# Patient Record
Sex: Female | Born: 1960 | Race: Black or African American | Hispanic: No | Marital: Single | Smoking: Former smoker
Health system: Southern US, Community
[De-identification: ages and names within clinical notes are randomized; demographics above are authoritative.]

## PROBLEM LIST (undated history)

## (undated) DIAGNOSIS — E785 Hyperlipidemia, unspecified: Secondary | ICD-10-CM

## (undated) DIAGNOSIS — E559 Vitamin D deficiency, unspecified: Secondary | ICD-10-CM

## (undated) DIAGNOSIS — I10 Essential (primary) hypertension: Secondary | ICD-10-CM

## (undated) DIAGNOSIS — C801 Malignant (primary) neoplasm, unspecified: Secondary | ICD-10-CM

## (undated) DIAGNOSIS — J45909 Unspecified asthma, uncomplicated: Secondary | ICD-10-CM

## (undated) DIAGNOSIS — E079 Disorder of thyroid, unspecified: Secondary | ICD-10-CM

## (undated) HISTORY — DX: Essential (primary) hypertension: I10

## (undated) HISTORY — DX: Unspecified asthma, uncomplicated: J45.909

## (undated) HISTORY — DX: Disorder of thyroid, unspecified: E07.9

## (undated) HISTORY — DX: Malignant (primary) neoplasm, unspecified: C80.1

## (undated) HISTORY — DX: Hyperlipidemia, unspecified: E78.5

## (undated) HISTORY — DX: Vitamin D deficiency, unspecified: E55.9

---

## 2002-02-06 DIAGNOSIS — C801 Malignant (primary) neoplasm, unspecified: Secondary | ICD-10-CM

## 2002-02-06 HISTORY — PX: BREAST LUMPECTOMY: SHX2

## 2002-02-06 HISTORY — DX: Malignant (primary) neoplasm, unspecified: C80.1

## 2008-02-05 ENCOUNTER — Ambulatory Visit: Payer: Self-pay | Admitting: Diagnostic Radiology

## 2008-02-05 ENCOUNTER — Ambulatory Visit (HOSPITAL_BASED_OUTPATIENT_CLINIC_OR_DEPARTMENT_OTHER): Admission: RE | Admit: 2008-02-05 | Discharge: 2008-02-05 | Payer: Self-pay | Admitting: Internal Medicine

## 2012-02-14 ENCOUNTER — Encounter: Payer: Self-pay | Admitting: Family

## 2012-02-14 ENCOUNTER — Ambulatory Visit (INDEPENDENT_AMBULATORY_CARE_PROVIDER_SITE_OTHER): Payer: BC Managed Care – PPO | Admitting: Family

## 2012-02-14 ENCOUNTER — Ambulatory Visit (HOSPITAL_BASED_OUTPATIENT_CLINIC_OR_DEPARTMENT_OTHER)
Admission: RE | Admit: 2012-02-14 | Discharge: 2012-02-14 | Disposition: A | Discharge status: Home / Self Care | Payer: BC Managed Care – PPO | Source: Ambulatory Visit | Attending: Family | Admitting: Family

## 2012-02-14 VITALS — BP 118/84 | HR 78 | Temp 97.7°F | Resp 16 | Ht 66.25 in | Wt 175.0 lb

## 2012-02-14 DIAGNOSIS — E049 Nontoxic goiter, unspecified: Secondary | ICD-10-CM | POA: Insufficient documentation

## 2012-02-14 DIAGNOSIS — E010 Iodine-deficiency related diffuse (endemic) goiter: Secondary | ICD-10-CM

## 2012-02-14 DIAGNOSIS — Z853 Personal history of malignant neoplasm of breast: Secondary | ICD-10-CM | POA: Insufficient documentation

## 2012-02-14 DIAGNOSIS — I10 Essential (primary) hypertension: Secondary | ICD-10-CM | POA: Insufficient documentation

## 2012-02-14 DIAGNOSIS — J45909 Unspecified asthma, uncomplicated: Secondary | ICD-10-CM

## 2012-02-14 DIAGNOSIS — E039 Hypothyroidism, unspecified: Secondary | ICD-10-CM | POA: Insufficient documentation

## 2012-02-14 LAB — BASIC METABOLIC PANEL
Calcium: 9.8 mg/dL (ref 8.4–10.5)
Sodium: 140 mEq/L (ref 135–145)

## 2012-02-14 MED ORDER — LISINOPRIL-HYDROCHLOROTHIAZIDE 20-25 MG PO TABS: 1.0000 | ORAL_TABLET | Freq: Every day | ORAL | Status: DC

## 2012-02-14 MED ORDER — LEVOTHYROXINE SODIUM 100 MCG PO TABS: 100.0000 ug | ORAL_TABLET | Freq: Every day | ORAL | Status: DC

## 2012-02-14 MED ORDER — ALBUTEROL SULFATE HFA 108 (90 BASE) MCG/ACT IN AERS: 2.0000 | INHALATION_SPRAY | Freq: Four times a day (QID) | RESPIRATORY_TRACT | Status: DC | PRN

## 2012-02-14 NOTE — Assessment & Plan Note (Signed)
Well controlled on lisinopril hctz.  Continue same, obtain bmet.

## 2012-02-14 NOTE — Assessment & Plan Note (Signed)
Reports that she has been of synthroid x 2 months. Resume and plan to check TSH in 1 month.

## 2012-02-14 NOTE — Assessment & Plan Note (Signed)
Patient continues to follow with oncology for surveillance.  Reports mammogram is up to date.

## 2012-02-14 NOTE — Progress Notes (Signed)
Subjective:    Patient ID: Donna Pace, female    DOB: May 10, 1960, 52 y.o.   MRN: [id number]  HPI  Ms. Ratajczak "Donna Pace"  is a 52 yr old female here today to establish care.  1) Hypothyroid- this is treated with levothyroxine . She has been treated for >20 yrs.    2) HTN- currently on lisinopril-HCTZ.  No complaints.    3) Asthma- She uses albuterol prn. Uses a few times a week.   4) Breast cancer- right breast 2004,  She underwent lumpectomy followed by chemotherapy and radiation.  She sees Dr. Allison Quarry. She sees oncology annually.  Review of Systems  Constitutional: Negative for unexpected weight change.  HENT: Negative for hearing loss and congestion.   Eyes: Negative for visual disturbance.  Respiratory: Negative for cough.   Cardiovascular: Negative for leg swelling.  Gastrointestinal: Negative for vomiting, diarrhea and constipation.  Genitourinary: Negative for dysuria and frequency.  Musculoskeletal: Negative for myalgias and arthralgias.  Skin: Negative for rash.  Neurological: Negative for headaches.  Hematological: Negative for adenopathy.  Psychiatric/Behavioral:       Denies hx or concerns about depression/anxiety   Past Medical History  Diagnosis Date  . Hypertension   . Asthma   . Thyroid disease     hypothyroidism  . Cancer 2004    breast cancer    History   Social History  . Marital Status: Married    Spouse Name: N/A    Number of Children: N/A  . Years of Education: N/A   Occupational History  . Not on file.   Social History Main Topics  . Smoking status: Current Every Day Smoker    Types: Cigarettes  . Smokeless tobacco: Never Used     Comment: 5 cigarettes daily  . Alcohol Use: No  . Drug Use: Not on file  . Sexually Active: Not on file   Other Topics Concern  . 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  [place] has 4 grandchildrenShe  completed HS    Past Surgical History  Procedure Date  . Breast lumpectomy 2004    right breast    Family History  Problem Relation Age of Onset  . Diabetes Mother   . Hypertension Mother   . Diabetes Father   . Cancer Sister     bone cancer    Allergies  Allergen Reactions  . Penicillins Rash    Current Outpatient Prescriptions on File Prior to Visit  Medication Sig Dispense Refill  . albuterol (VENTOLIN HFA) 108 (90 BASE) MCG/ACT inhaler Inhale 2 puffs into the lungs every 6 (six) hours as needed.  1 Inhaler  2  . levothyroxine (SYNTHROID, LEVOTHROID) 100 MCG tablet Take 1 tablet (100 mcg total) by mouth daily.  30 tablet  2  . lisinopril-hydrochlorothiazide (PRINZIDE,ZESTORETIC) 20-25 MG per tablet Take 1 tablet by mouth daily.  30 tablet  3    BP 118/84  Pulse 78  Temp 97.7 F (36.5 C) (Oral)  Resp 16  Ht 5' 6.25" (1.683 m)  Wt 175 lb 0.6 oz (79.398 kg)  BMI 28.04 kg/m2  SpO2 99%  LMP 02/06/2006       Objective:   Physical Exam  Constitutional: She is oriented to person, place, and time. She appears well-developed and well-nourished. No distress.  HENT:  Head: Normocephalic and atraumatic.  Right Ear: Tympanic membrane and ear canal normal.  Left  Ear: Tympanic membrane and ear canal normal.  Mouth/Throat: No oropharyngeal exudate, posterior oropharyngeal edema or posterior oropharyngeal erythema.  Neck: Thyromegaly present.  Cardiovascular: Normal rate and regular rhythm.   No murmur heard. Pulmonary/Chest: Effort normal and breath sounds normal. No respiratory distress. She has no wheezes. She has no rales. She exhibits no tenderness.  Musculoskeletal: She exhibits no edema.  Neurological: She is alert and oriented to person, place, and time.  Skin: Skin is warm and dry.  Psychiatric: She has a normal mood and affect. Her behavior is normal. Judgment and thought content normal.          Assessment & Plan:

## 2012-02-14 NOTE — Assessment & Plan Note (Signed)
Seems well controlled on albuterol.  Continue prn.

## 2012-02-14 NOTE — Patient Instructions (Addendum)
Please complete your lab work prior to leaving. Follow up in 1 month for a fasting physical. Welcome to [facility]!

## 2012-02-16 ENCOUNTER — Encounter: Payer: Self-pay | Admitting: Family

## 2012-02-26 ENCOUNTER — Telehealth: Payer: Self-pay | Admitting: Family

## 2012-02-26 NOTE — Telephone Encounter (Signed)
Received medical records from [facility]

## 2012-03-15 ENCOUNTER — Encounter: Payer: Self-pay | Admitting: Family

## 2012-03-15 ENCOUNTER — Ambulatory Visit (INDEPENDENT_AMBULATORY_CARE_PROVIDER_SITE_OTHER): Payer: BC Managed Care – PPO | Admitting: Family

## 2012-03-15 VITALS — BP 110/70 | HR 84 | Temp 98.1°F | Resp 16 | Ht 66.25 in | Wt 170.0 lb

## 2012-03-15 DIAGNOSIS — Z0000 Encounter for general adult medical examination without abnormal findings: Secondary | ICD-10-CM | POA: Insufficient documentation

## 2012-03-15 DIAGNOSIS — Z23 Encounter for immunization: Secondary | ICD-10-CM

## 2012-03-15 LAB — CBC WITH DIFFERENTIAL/PLATELET
Eosinophils Relative: 4 % (ref 0–5)
HCT: 37.6 % (ref 36.0–46.0)
Hemoglobin: 12.9 g/dL (ref 12.0–15.0)
Lymphocytes Relative: 39 % (ref 12–46)
Lymphs Abs: 2 10*3/uL (ref 0.7–4.0)
MCV: 88.7 fL (ref 78.0–100.0)
Monocytes Absolute: 0.3 10*3/uL (ref 0.1–1.0)
Platelets: 277 10*3/uL (ref 150–400)
RBC: 4.24 MIL/uL (ref 3.87–5.11)
WBC: 5.2 10*3/uL (ref 4.0–10.5)

## 2012-03-15 LAB — BASIC METABOLIC PANEL WITH GFR
CO2: 29 mEq/L (ref 19–32)
Chloride: 105 mEq/L (ref 96–112)
Potassium: 4.6 mEq/L (ref 3.5–5.3)
Sodium: 141 mEq/L (ref 135–145)

## 2012-03-15 LAB — HEPATIC FUNCTION PANEL
Alkaline Phosphatase: 60 U/L (ref 39–117)
Indirect Bilirubin: 0.3 mg/dL (ref 0.0–0.9)
Total Protein: 7 g/dL (ref 6.0–8.3)

## 2012-03-15 LAB — LIPID PANEL
HDL: 47 mg/dL (ref >39)
LDL Cholesterol: 129 mg/dL — ABNORMAL HIGH (ref 0–99)

## 2012-03-15 LAB — TSH: TSH: 5.299 u[IU]/mL — ABNORMAL HIGH (ref 0.350–4.500)

## 2012-03-15 MED ORDER — CALCIUM CARBONATE-VITAMIN D 600-400 MG-UNIT PO TABS: 1.0000 | ORAL_TABLET | Freq: Two times a day (BID) | ORAL | Status: AC

## 2012-03-15 NOTE — Progress Notes (Signed)
Subjective:    Patient ID: Donna Pace, female    DOB: November 30, 1960, 52 y.o.   MRN: [id number]  HPI  Patient presents today for complete physical.  Immunizations: ? Last tetanus, flu up to date Diet: eats 1-2 meals a day.  Exercise: None.  Colonoscopy: due Dexa: has not had in 9 yrs Pap Smear: Due in May with GYN Mammogram: will complete this month. Tobacco abuse-  2 packs a week.   Review of Systems  Constitutional: Negative for unexpected weight change.  HENT: Negative for congestion.   Eyes: Negative for visual disturbance.  Respiratory: Negative for cough.   Cardiovascular: Negative for chest pain.  Gastrointestinal: Negative for diarrhea and constipation.  Genitourinary: Negative for dysuria and frequency.  Musculoskeletal: Negative for myalgias and arthralgias.  Skin: Negative for rash.  Neurological: Negative for headaches.  Hematological: Negative for adenopathy.  Psychiatric/Behavioral:       Denies depression/anxiety   Past Medical History  Diagnosis Date  . Hypertension   . Asthma   . Thyroid disease     hypothyroidism  . Cancer 2004    breast cancer    History   Social History  . Marital Status: Married    Spouse Name: N/A    Number of Children: N/A  . Years of Education: N/A   Occupational History  . Not on file.   Social History Main Topics  . Smoking status: Current Every Day Smoker    Types: Cigarettes  . Smokeless tobacco: Never Used     Comment: 5 cigarettes daily  . Alcohol Use: No  . Drug Use: Not on file  . Sexually Active: Not on file   Other Topics Concern  . 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  [place] has 4 grandchildrenShe completed HS    Past Surgical History  Procedure Date  . Breast lumpectomy 2004    right breast    Family History  Problem Relation Age of Onset  . Diabetes Mother   . Hypertension Mother   .  Diabetes Father   . Cancer Sister     bone cancer    Allergies  Allergen Reactions  . Penicillins Rash    Current Outpatient Prescriptions on File Prior to Visit  Medication Sig Dispense Refill  . albuterol (VENTOLIN HFA) 108 (90 BASE) MCG/ACT inhaler Inhale 2 puffs into the lungs every 6 (six) hours as needed.  1 Inhaler  2  . levothyroxine (SYNTHROID, LEVOTHROID) 100 MCG tablet Take 1 tablet (100 mcg total) by mouth daily.  30 tablet  2  . lisinopril-hydrochlorothiazide (PRINZIDE,ZESTORETIC) 20-25 MG per tablet Take 1 tablet by mouth daily.  30 tablet  3  . Calcium Carbonate-Vitamin D (CALTRATE 600+D) 600-400 MG-UNIT per tablet Take 1 tablet by mouth 2 (two) times daily.        BP 110/70  Pulse 84  Temp 98.1 F (36.7 C) (Oral)  Resp 16  Ht 5' 6.25" (1.683 m)  Wt 170 lb (77.111 kg)  BMI 27.23 kg/m2  SpO2 99%  LMP 02/06/2006       Objective:   Physical Exam Physical Exam  Constitutional: She is oriented to person, place, and time. She appears well-developed and well-nourished. No distress.  HENT:  Head: Normocephalic and atraumatic.  Right Ear: Tympanic membrane and ear canal normal.  Left Ear: Tympanic membrane and ear canal normal.  Mouth/Throat: Oropharynx is clear and  moist.  Eyes: Pupils are equal, round, and reactive to light. No scleral icterus.  Neck: Normal range of motion. No thyromegaly present.  Cardiovascular: Normal rate and regular rhythm.   No murmur heard. Pulmonary/Chest: Effort normal and breath sounds normal. No respiratory distress. He has no wheezes. She has no rales. She exhibits no tenderness.  Abdominal: Soft. Bowel sounds are normal. He exhibits no distension and no mass. There is no tenderness. There is no rebound and no guarding.  Musculoskeletal: She exhibits no edema.  Lymphadenopathy:    She has no cervical adenopathy.  Neurological: She is alert and oriented to person, place, and time.  She exhibits normal muscle tone. Coordination  normal.  Skin: Skin is warm and dry.  Psychiatric: She has a normal mood and affect. Her behavior is normal. Judgment and thought content normal.  Breasts: Examined lying Right: Without masses, retractions, discharge or axillary adenopathy. Scar noted right upper/outer breast with some associated scar tissue Left: Without masses, retractions, discharge or axillary adenopathy.  GYN- deferred to next visit.          Assessment & Plan:          Assessment & Plan:

## 2012-03-15 NOTE — Patient Instructions (Addendum)
You will be contacted about your referral for colonoscopy.  Please let us know if you have not heard back within 1 week about your referral. Schedule bone density at front desk. Complete lab work prior to leaving.  Follow up in 6 months.

## 2012-03-15 NOTE — Assessment & Plan Note (Signed)
Pt counseled on healthy diet, exercise. Tdap today. She will complete scheduled mammogram in 1 month.  Obtain fasting labs, refer to gi for colo, order dexa. Discussed importance of tobacco cessation, recommended trial of nicorette gum. Add calcium supplement for bone health.

## 2012-03-16 ENCOUNTER — Telehealth: Payer: Self-pay | Admitting: Family

## 2012-03-16 DIAGNOSIS — R9431 Abnormal electrocardiogram [ECG] [EKG]: Secondary | ICD-10-CM

## 2012-03-16 DIAGNOSIS — E039 Hypothyroidism, unspecified: Secondary | ICD-10-CM

## 2012-03-16 LAB — URINALYSIS, ROUTINE W REFLEX MICROSCOPIC
Hgb urine dipstick: NEGATIVE
Ketones, ur: NEGATIVE mg/dL
Nitrite: NEGATIVE
Protein, ur: NEGATIVE mg/dL
Specific Gravity, Urine: 1.016 (ref 1.005–1.030)
Urobilinogen, UA: 0.2 mg/dL (ref 0.0–1.0)

## 2012-03-16 MED ORDER — LEVOTHYROXINE SODIUM 125 MCG PO TABS: 125.0000 ug | ORAL_TABLET | Freq: Every day | ORAL | Status: DC

## 2012-03-16 NOTE — Telephone Encounter (Signed)
Pls let pt know that I would like to increase her synthroid from to .  She should repeat TSH in 6 weeks. (Dx Hypothyroid) Also, mild abnormality on EKG- may be normal for her, but I would like her to complete a stress test (pended below) to be certain that her heart is healthy.

## 2012-03-19 NOTE — Telephone Encounter (Signed)
Pt left message returning my call. Attempted to reach pt and left detailed message at [phone] re: instructions below and to call if any questions. Future lab order entered. Copy given to the lab and mailed to pt as a reminder.

## 2012-03-19 NOTE — Telephone Encounter (Signed)
Left message on home # to return my call. 

## 2012-03-28 ENCOUNTER — Other Ambulatory Visit: Payer: BC Managed Care – PPO

## 2012-04-02 ENCOUNTER — Telehealth: Payer: Self-pay | Admitting: Family

## 2012-04-02 DIAGNOSIS — R9431 Abnormal electrocardiogram [ECG] [EKG]: Secondary | ICD-10-CM

## 2012-04-02 NOTE — Addendum Note (Signed)
Addended by: Sandford Craze on: 04/02/2012 01:23 PM   Modules accepted: Orders

## 2012-04-02 NOTE — Telephone Encounter (Signed)
Spoke with physician at [facility]. Reviewed pt case, EKG. She denied myocardial imaging- "doesn't meet criteria."  She says only exercise treadmill without imaging will be covered.  Will order.  Myriam Jacobson, could you please make sure that this is scheduled with cards? Thanks.

## 2012-04-02 NOTE — Telephone Encounter (Signed)
Message copied by Sandford Craze on Tue Apr 02, 2012  1:06 PM ------      Message from: Eulah Pont      Created: Tue Mar 26, 2012  3:43 PM      Regarding: Peer to Peer For Nuclear Stress        Peer to Peer Required-    [phone]   Ref  #  [id number] ------

## 2012-04-03 ENCOUNTER — Telehealth: Payer: Self-pay | Admitting: *Deleted

## 2012-04-03 NOTE — Telephone Encounter (Signed)
Received denial from [facility] that nuclear stress test has been denied as documentation has not been provided stating pt is unable to walk on a treadmill. Left detailed message on pt's home# to call and let us know if she would be unable to walk on the treadmill or if she has knee problems, uses cane, etc. . . . Marland Kitchen

## 2012-04-08 NOTE — Telephone Encounter (Signed)
Attempted to reach pt and left detailed message on cell# re: below request.

## 2012-04-09 ENCOUNTER — Encounter: Payer: Self-pay | Admitting: Family

## 2012-04-09 DIAGNOSIS — I340 Nonrheumatic mitral (valve) insufficiency: Secondary | ICD-10-CM | POA: Insufficient documentation

## 2012-04-09 DIAGNOSIS — E559 Vitamin D deficiency, unspecified: Secondary | ICD-10-CM

## 2012-04-09 DIAGNOSIS — E785 Hyperlipidemia, unspecified: Secondary | ICD-10-CM | POA: Insufficient documentation

## 2012-04-09 HISTORY — DX: Vitamin D deficiency, unspecified: E55.9

## 2012-04-09 NOTE — Telephone Encounter (Signed)
Unable to reach pt re: treadmill stress test. I have left 2 detailed messages for pt to call us back with requested info below and have gotten no response. Please advise if you want to proceed with standard treadmill and if so please place order.

## 2012-04-09 NOTE — Telephone Encounter (Signed)
I do want to proceed with exercise treadmill and order has been placed.

## 2012-04-15 ENCOUNTER — Encounter: Payer: BC Managed Care – PPO | Admitting: Physician Assistant

## 2012-04-18 ENCOUNTER — Telehealth: Payer: Self-pay | Admitting: Family

## 2012-04-18 NOTE — Telephone Encounter (Signed)
Message copied by Sandford Craze on Thu Apr 18, 2012 12:34 PM ------      Message from: Darral Dash E      Created: Thu Apr 18, 2012  8:48 AM      Regarding: FW: GXT                    FYI            ----- Message -----         From: Gerome Sam         Sent: 04/16/2012  10:43 AM           To: Keith Rake Aheron      Subject: GXT                                                      04/16/12 Patient cancel.       ------

## 2012-06-05 ENCOUNTER — Telehealth: Payer: Self-pay | Admitting: Family

## 2012-06-05 MED ORDER — LEVOTHYROXINE SODIUM 125 MCG PO TABS: 125.0000 ug | ORAL_TABLET | Freq: Every day | ORAL | Status: DC

## 2012-06-05 NOTE — Telephone Encounter (Signed)
Rx sent in to pharmacy. 

## 2012-06-05 NOTE — Telephone Encounter (Signed)
Refill- levothyroxine 0.125mg ( ) tab. TK 1 T PO QD. Qty 30 last fill 4.26.14

## 2012-06-19 ENCOUNTER — Ambulatory Visit: Payer: BC Managed Care – PPO | Admitting: Family

## 2012-06-19 DIAGNOSIS — Z0289 Encounter for other administrative examinations: Secondary | ICD-10-CM

## 2012-07-23 ENCOUNTER — Telehealth: Payer: Self-pay | Admitting: Family

## 2012-07-23 MED ORDER — ALBUTEROL SULFATE HFA 108 (90 BASE) MCG/ACT IN AERS: 2.0000 | INHALATION_SPRAY | Freq: Four times a day (QID) | RESPIRATORY_TRACT | Status: AC | PRN

## 2012-07-23 NOTE — Telephone Encounter (Signed)
Ventolin hfa inh w dos ctr 200 puffs inhale 2 puffs by mouthj into the lungs every 6 hours as needed last fill 06-08-12

## 2012-08-14 ENCOUNTER — Telehealth: Payer: Self-pay | Admitting: Family

## 2012-08-14 NOTE — Telephone Encounter (Signed)
[facility] called stating that patient has never called back to schedule her appointment. She was referred back in February?

## 2012-08-18 ENCOUNTER — Encounter: Payer: Self-pay | Admitting: Family

## 2012-08-18 NOTE — Telephone Encounter (Signed)
See letter to pt

## 2012-08-20 NOTE — Telephone Encounter (Signed)
Letter mailed to pt.  

## 2012-11-25 ENCOUNTER — Telehealth: Payer: Self-pay | Admitting: Family

## 2012-11-25 NOTE — Telephone Encounter (Signed)
Left message for patient to return my call.

## 2012-11-25 NOTE — Telephone Encounter (Signed)
Please call pt and arrange follow up visit.  Our records show she is due for follow up.

## 2012-11-27 NOTE — Telephone Encounter (Signed)
Left detailed message informing patient that she is due for a follow up visit.

## 2012-11-28 NOTE — Telephone Encounter (Signed)
Talked to patient mother and asked her to have patient call our office to schedule an appointment

## 2012-12-12 ENCOUNTER — Other Ambulatory Visit: Payer: Self-pay

## 2014-07-26 IMAGING — US US SOFT TISSUE HEAD/NECK
1 series · 14 of 23 positions shown · non-contrast
Comparison: None.

CLINICAL DATA: Thyromegaly, hypothyroidism.

THYROID ULTRASOUND
TECHNIQUE: Ultrasound examination of the thyroid gland and adjacent
soft tissues was performed.

[Series 1: us soft tissue head/neck · 0.08mm/px · 14 of 23 slices shown]
[im 1/23]
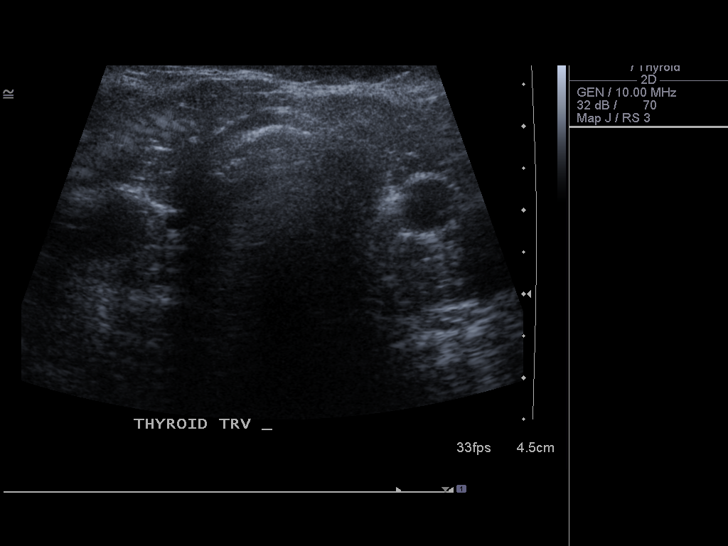
[im 3/23]
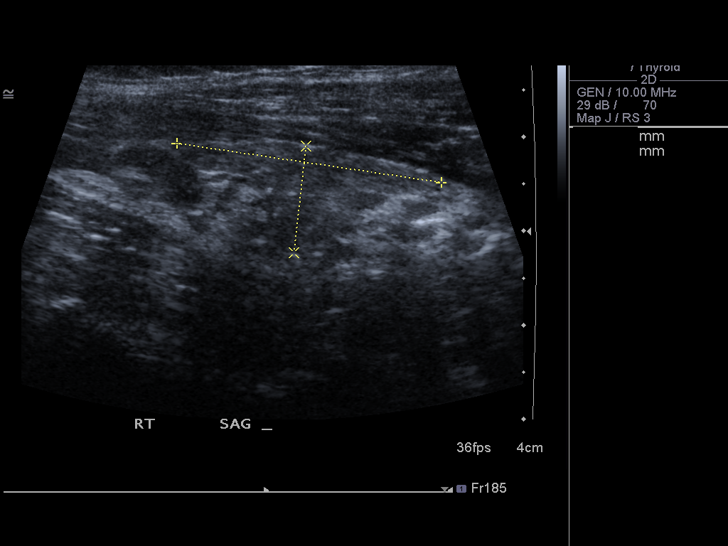
[im 5/23]
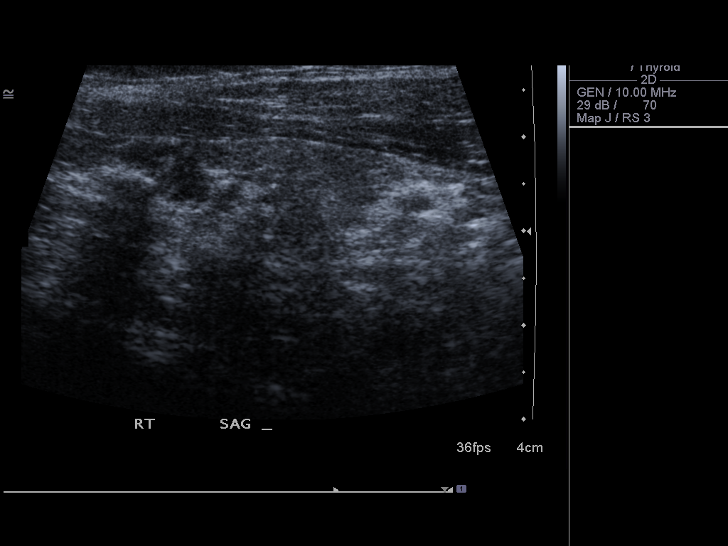
[im 6/23]
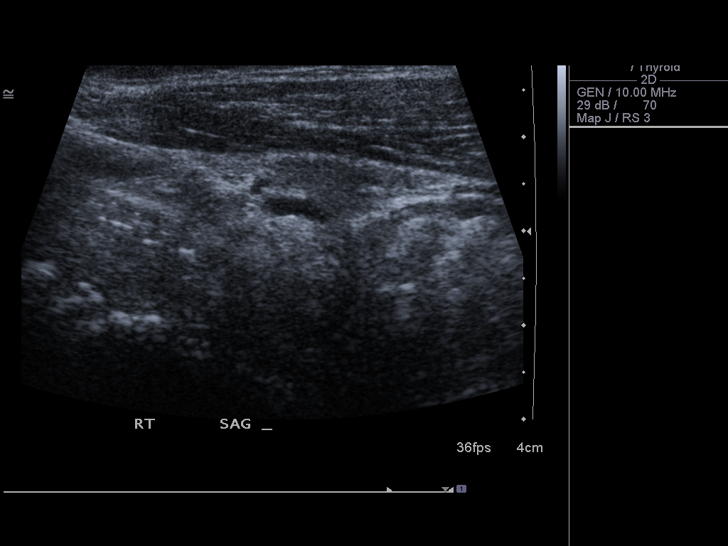
[im 8/23]
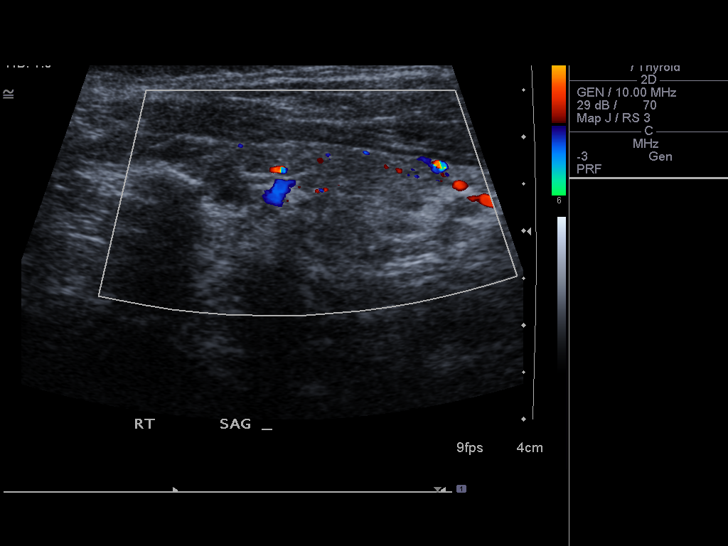
[im 10/23]
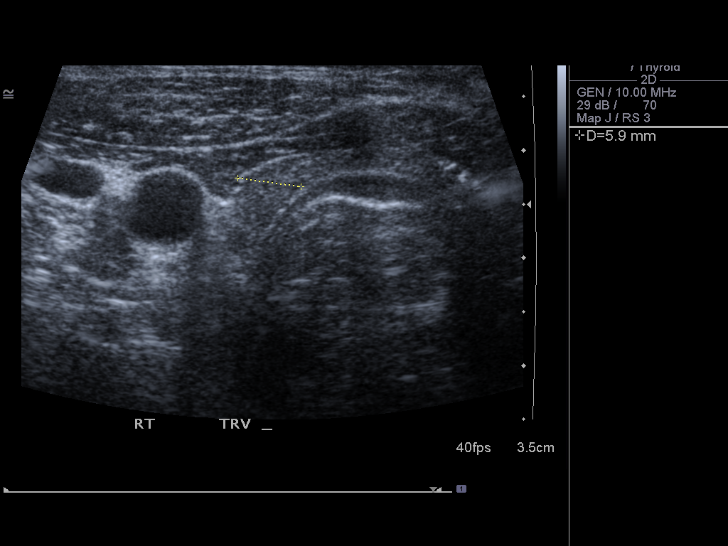
[im 11/23]
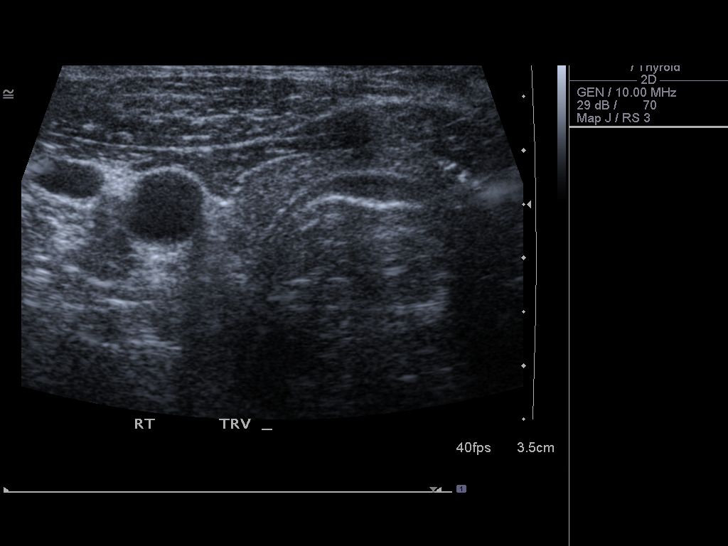
[im 13/23]
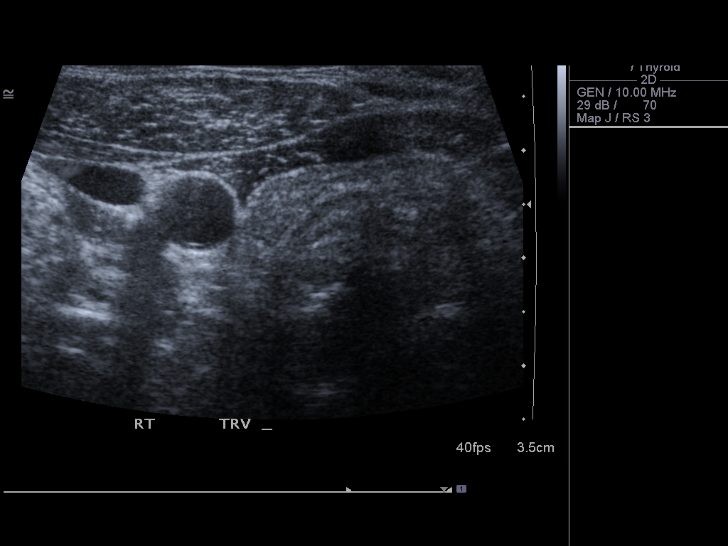
[im 14/23]
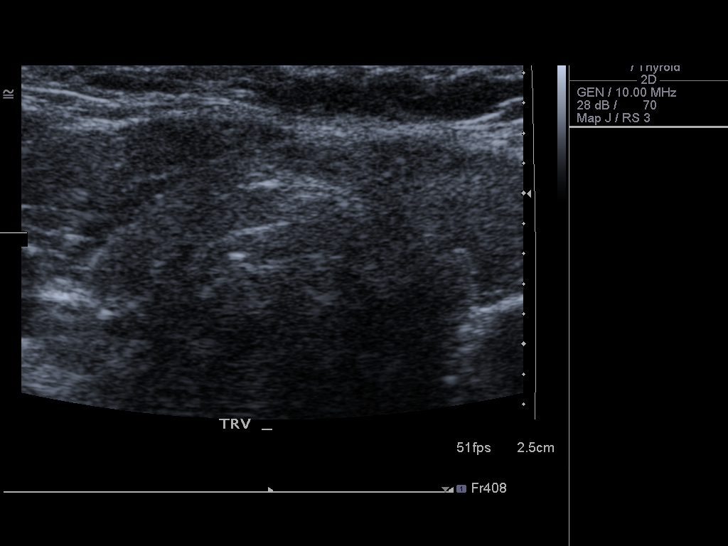
[im 16/23]
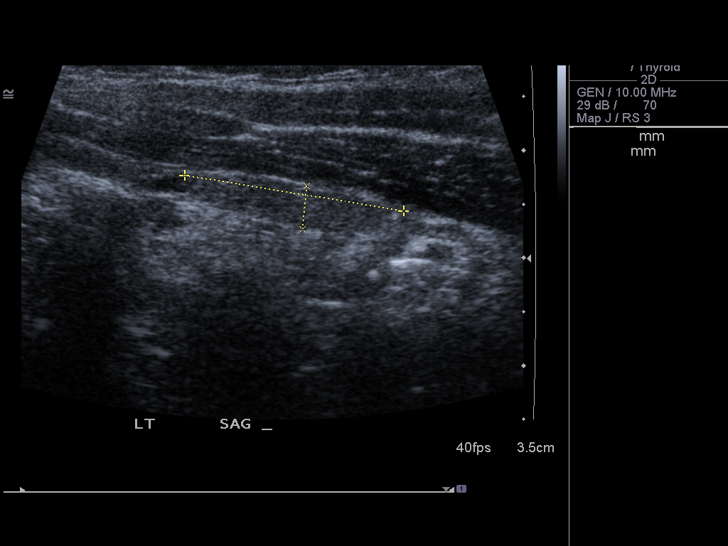
[im 18/23]
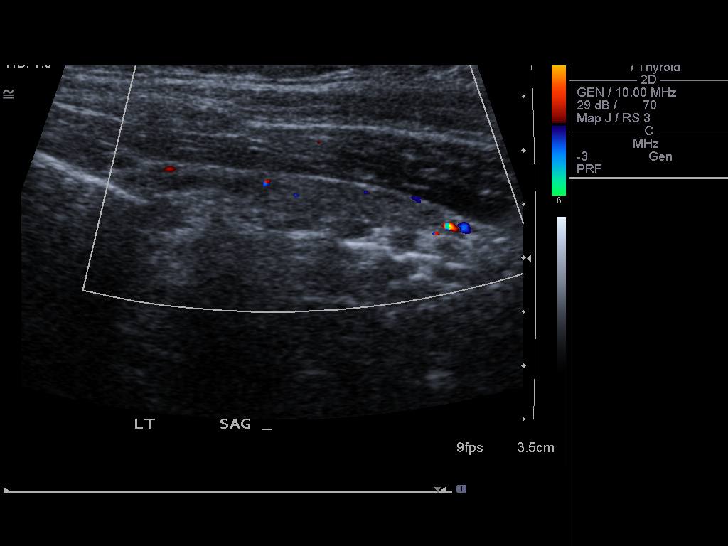
[im 19/23]
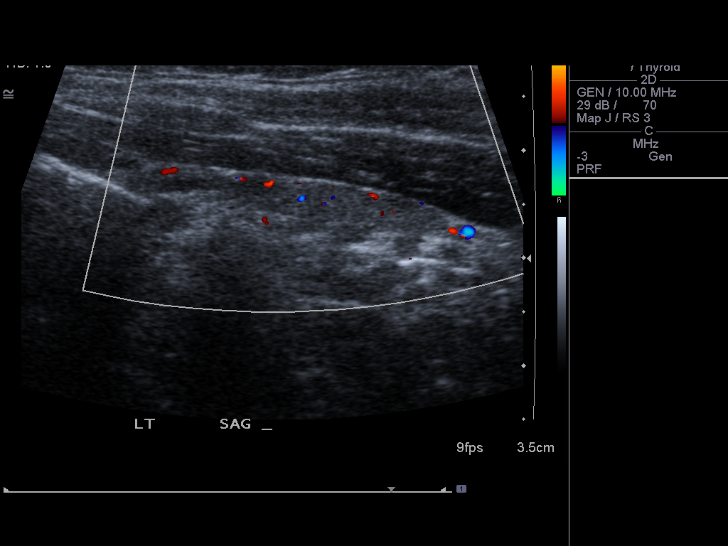
[im 21/23]
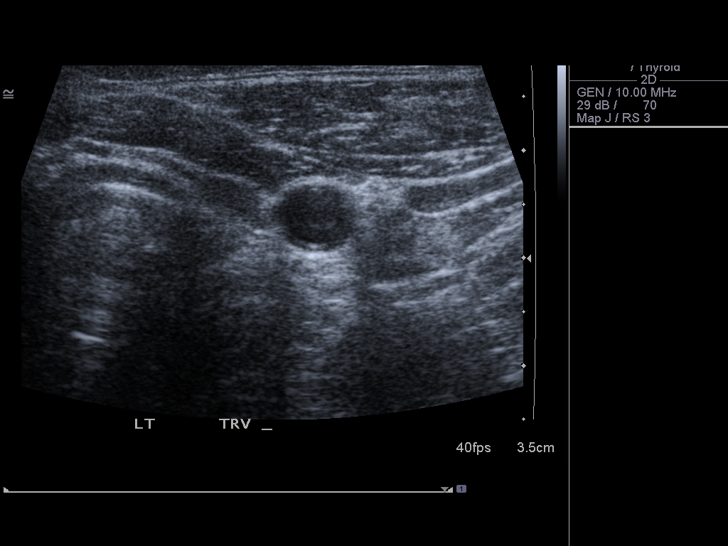
[im 23/23]
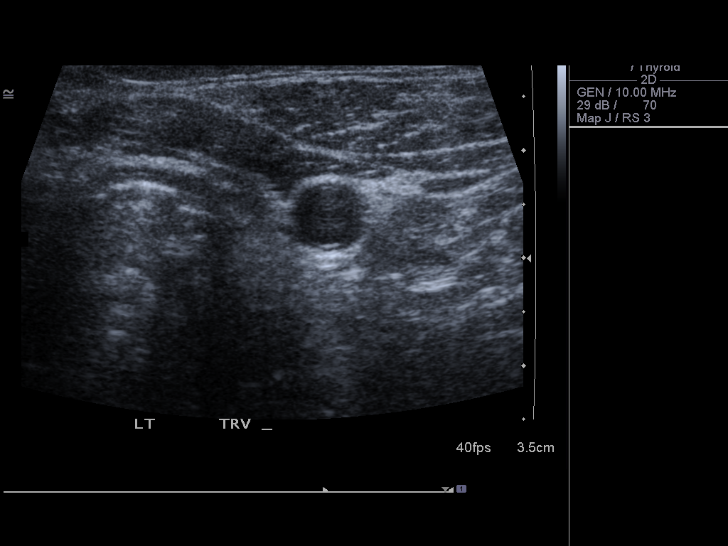

[14 of 23 positions shown; findings below may reference images not displayed]

FINDINGS: Homogeneous thyroid echotexture is noted.

Right thyroid lobe:  Measures 2.8 x 1.1 x 0.6 cm.
Left thyroid lobe:  Measures 2.1 x 0.6 x 0.4 cm.
Isthmus:  Measures 2 mm.

Focal nodules:  None

Lymphadenopathy:  None visualized.
IMPRESSION: Normal thyroid ultrasound.

## 2015-08-31 ENCOUNTER — Ambulatory Visit: Payer: 59 | Attending: Surgery | Admitting: Physical Therapy

## 2015-08-31 DIAGNOSIS — I890 Lymphedema, not elsewhere classified: Secondary | ICD-10-CM | POA: Insufficient documentation

## 2015-08-31 DIAGNOSIS — M79601 Pain in right arm: Secondary | ICD-10-CM | POA: Diagnosis not present

## 2015-08-31 DIAGNOSIS — M25611 Stiffness of right shoulder, not elsewhere classified: Secondary | ICD-10-CM | POA: Diagnosis present

## 2015-08-31 NOTE — Therapy (Signed)
[place], [place], [address] Phone: [phone]   Fax:  [phone]  Physical Therapy Evaluation  Patient Details  Name: Donna Pace MRN: [id number] Date of Birth: [birth year] Referring Provider: Dr. Charna Archer   Encounter Date: 08/31/2015      PT End of Session - 08/31/15 1211    Visit Number 1   Number of Visits 13   Date for PT Re-Evaluation 10/01/15   PT Start Time 1105   PT Stop Time 1155   PT Time Calculation (min) 50 min   Activity Tolerance Patient tolerated treatment well   Behavior During Therapy [facility] for tasks assessed/performed      Past Medical History:  Diagnosis Date  . Asthma   . Cancer 2004   breast cancer  . Hyperlipidemia   . Hypertension   . Thyroid disease    hypothyroidism  . Unspecified vitamin D deficiency 04/09/2012    Past Surgical History:  Procedure Laterality Date  . BREAST LUMPECTOMY  2004   right breast    There were no vitals filed for this visit.       Subjective Assessment - 08/31/15 1128    Subjective "I've had it forever"  re: swelling in her right arm. she feels like it is getting worse    Pertinent History Breast cancer with lumpectomy with 7 nodes removed with chemo and radiation in 2004. She notieced swelling in right arm about 7 years ago and never had any treatment.  Occasionally fullness in breast    Patient Stated Goals to get some help with her arm swelling.    Currently in Pain? Yes   Pain Score 9    Pain Location Hand   Pain Orientation Right   Pain Descriptors / Indicators Tightness;Aching   Pain Type Chronic pain   Pain Radiating Towards up into forearm    Pain Onset More than a month ago   Pain Frequency Constant   Aggravating Factors  when she uses her right hand    Pain Relieving Factors goes to sleep    Effect of Pain on Daily Activities she has to stop what she is doing.             [facility] PT Assessment -  08/31/15 0001      Assessment   Medical Diagnosis  right breast cancer    Referring Provider Dr. Charna Archer    Onset Date/Surgical Date 07/08/02   Hand Dominance Right   Prior Therapy none     Precautions   Precautions Other (comment)   Precaution Comments previous cancer with surgery, chemo and radiation therapy      Restrictions   Weight Bearing Restrictions No     Balance Screen   Has the patient fallen in the past 6 months No   Has the patient had a decrease in activity level because of a fear of falling?  No   Is the patient reluctant to leave their home because of a fear of falling?  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    Leisure  spends times with family      Cognition   Overall Cognitive Status Within Functional Limits for tasks assessed     Observation/Other Assessments   Observations pt with visible lymphedema in right lower arm and hand  nails are discolored from chemotherapy    Skin Integrity no open areas   Other Surveys  --  lymphedema life impact scale 57 or 84% impaired      Sensation   Light Touch Not tested     Coordination   Gross Motor Movements are Fluid and Coordinated No  scapular dyskinesia; decreased right scapular stabalization     Posture/Postural Control   Posture/Postural Control Postural limitations   Postural Limitations Rounded Shoulders;Forward head     AROM   Right Shoulder Flexion 155 Degrees   Right Shoulder ABduction 90 Degrees  painful, can't hold it up long    Right Shoulder Internal Rotation 70 Degrees   Right Shoulder External Rotation 70 Degrees  pain   Left Shoulder  Flexion 160 Degrees   Left Shoulder ABduction 145 Degrees   Left Shoulder Internal Rotation 70 Degrees   Left Shoulder External Rotation 70 Degrees           LYMPHEDEMA/ONCOLOGY QUESTIONNAIRE - 08/31/15 1132      Surgeries   Number Lymph Nodes Removed 7     What other symptoms do you have   Are you Having Heaviness or Tightness Yes   Are you having Pain Yes   Are you having pitting edema Yes   Body Site right hand forear, above elbow    Is it Hard or Difficult finding clothes that fit Yes   Do you have infections No   Stemmer Sign Yes     Lymphedema Stage   Stage STAGE 2 SPONTANEOUSLY IRREVERSIBLE     Right Upper Extremity Lymphedema   15 cm Proximal to Olecranon Process 33 cm   10 cm Proximal to Olecranon Process 32.5 cm   Olecranon Process 32 cm   15 cm Proximal to Ulnar Styloid Process 32 cm   10 cm Proximal to Ulnar Styloid Process 28.5 cm   Just Proximal to Ulnar Styloid Process 19.5 cm   Across Hand at [employer] 23 cm   At [place] of 2nd Digit 7.1 cm     Left Upper Extremity Lymphedema   15 cm Proximal to Olecranon Process 32 cm   10 cm Proximal to Olecranon Process 31 cm   Olecranon Process 27 cm   15 cm Proximal to Ulnar Styloid Process 25.5 cm   10 cm Proximal to Ulnar Styloid Process 21.5 cm   Just Proximal to Ulnar Styloid Process 17 cm   Across Hand at [employer] 20 cm   At [place] of 2nd Digit 6.3 cm                                Long Term Clinic Goals - 08/31/15 1223      CC Long Term Goal  #1   Title Patient will have a circumferential reduction of     2      cm at   10   cm above the   right      ulnar styloid    Baseline 28.5 on 08/31/2015   Time 4   Period Weeks   Status New     CC Long Term Goal  #2   Title Patient will  report a decrease in pain by 50% so they can perform daily activities with greater ease   Baseline 9/10 on 08/31/2015   Time 4   Period Weeks   Status New     CC Long Term Goal  #3   Title  Patient will be know how to obtain and use compression garments for maintenance phase of treatment   Time 4   Period Weeks     CC Long Term Goal  #4   Title Patient will be independent in self manual lymph drainage   Time 4   Period Weeks   Status New            Plan - 08/31/15 1211    Clinical Impression Statement 54 yo female with lymphedema in right arm after lumpectomy with 7 nodes removed and radiation treatment.  She has been developing lymphedema in her right arm for several years and is now having signficant congestion and pain. She has not had any prior education or treatment for lymphedema though she has tried some drugstore sleeves. Anticipate she would benefit from complete decongestive therapy with flat knit compression sleeve and glove and also a compression pump for long term management. 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 She is not sure if she will be able to perform her job while being bandages so that may affect her treatment progression.    Rehab Potential Good   Clinical Impairments Affecting Rehab Potential previous 7 lymph nodes removed and upper quarter radiation   PT Frequency 3x / week   PT Duration 4 weeks   PT Treatment/Interventions ADLs/Self Care Home Management;Therapeutic activities;Taping;Therapeutic exercise;DME Instruction;Neuromuscular re-education;Manual techniques;Manual lymph drainage;Patient/family education;Passive range of motion;Scar mobilization   PT Next Visit Plan Begin manual lymph drainage with bandaging if she is agreeable.  Teach eleveation and exercise as beginning self mangement techniques.  Teach dowel rod supine exercise.    Recommended Other Services ABC class   Consulted and Agree with Plan of Care Patient      Patient will benefit from skilled therapeutic intervention in order to improve the following deficits and impairments:  Decreased range of motion, Increased fascial  restricitons, Impaired UE functional use, Decreased knowledge of precautions, Pain, Decreased scar mobility, Decreased knowledge of use of DME, Decreased strength, Increased edema, Postural dysfunction  Visit Diagnosis: Pain In Right Arm - Plan: PT plan of care cert/re-cert  Lymphedema, not elsewhere classified - Plan: PT plan of care cert/re-cert  Stiffness of right shoulder, not elsewhere classified - Plan: PT plan of care cert/re-cert     Problem List Patient Active Problem List   Diagnosis Date Noted  . Other and unspecified hyperlipidemia 04/09/2012  . Mitral valve regurgitation 04/09/2012  . Unspecified vitamin D deficiency 04/09/2012  . Routine general medical examination at a health care facility 03/15/2012  . Hypothyroid 02/14/2012  . HTN (hypertension) 02/14/2012  . Asthma 02/14/2012  . HX: breast cancer 02/14/2012   Donato Heinz. Owens Shark PT  Norwood Levo 08/31/2015, 12:36 PM  [place], [place], [address] Phone: [phone]   Fax:  [phone]  Name: Donna Pace MRN: [id number] Date of Birth: Jan 27, 1961

## 2015-09-01 ENCOUNTER — Ambulatory Visit: Payer: 59 | Admitting: Physical Therapy

## 2015-09-01 DIAGNOSIS — M79601 Pain in right arm: Secondary | ICD-10-CM | POA: Diagnosis not present

## 2015-09-01 DIAGNOSIS — M25611 Stiffness of right shoulder, not elsewhere classified: Secondary | ICD-10-CM

## 2015-09-01 DIAGNOSIS — I890 Lymphedema, not elsewhere classified: Secondary | ICD-10-CM

## 2015-09-01 NOTE — Therapy (Signed)
[place], [place], [address] Phone: [phone]   Fax:  [phone]  Physical Therapy Treatment  Patient Details  Name: Donna Pace MRN: [id number] Date of Birth: Sep 21, 1960 Referring Provider: Dr. Charna Archer   Encounter Date: 09/01/2015      PT End of Session - 09/01/15 0959    Visit Number 2   Number of Visits 13   Date for PT Re-Evaluation 10/01/15   PT Start Time 0845   PT Stop Time 0940   PT Time Calculation (min) 55 min   Activity Tolerance Patient tolerated treatment well   Behavior During Therapy [facility] for tasks assessed/performed      Past Medical History:  Diagnosis Date  . Asthma   . Cancer 2004   breast cancer  . Hyperlipidemia   . Hypertension   . Thyroid disease    hypothyroidism  . Unspecified vitamin D deficiency 04/09/2012    Past Surgical History:  Procedure Laterality Date  . BREAST LUMPECTOMY  2004   right breast    There were no vitals filed for this visit.      Subjective Assessment - 09/01/15 0850    Subjective pt willing to try the bandages today and try them at work tonight.  She will remove them if she is not able to do her job with them on and will bring everything back    Pertinent History Breast cancer with lumpectomy with 7 nodes removed with chemo and radiation in 2004. She notieced swelling in right arm about 7 years ago and never had any treatment.  Occasionally fullness in breast    Patient Stated Goals to get some help with her arm swelling.    Currently in Pain? Yes   Pain Score 8    Pain Location Arm   Pain Orientation Right;Lower   Pain Descriptors / Indicators Aching;Tightness               LYMPHEDEMA/ONCOLOGY QUESTIONNAIRE - 08/31/15 1132      Surgeries   Number Lymph Nodes Removed 7     What other symptoms do you have   Are you Having Heaviness or Tightness Yes   Are you having Pain Yes   Are you having pitting edema  Yes   Body Site right hand forear, above elbow    Is it Hard or Difficult finding clothes that fit Yes   Do you have infections No   Stemmer Sign Yes     Lymphedema Stage   Stage STAGE 2 SPONTANEOUSLY IRREVERSIBLE     Right Upper Extremity Lymphedema   15 cm Proximal to Olecranon Process 33 cm   10 cm Proximal to Olecranon Process 32.5 cm   Olecranon Process 32 cm   15 cm Proximal to Ulnar Styloid Process 32 cm   10 cm Proximal to Ulnar Styloid Process 28.5 cm   Just Proximal to Ulnar Styloid Process 19.5 cm   Across Hand at [employer] 23 cm   At [employer] of 2nd Digit 7.1 cm     Left Upper Extremity Lymphedema   15 cm Proximal to Olecranon Process 32 cm   10 cm Proximal to Olecranon Process 31 cm   Olecranon Process 27 cm   15 cm Proximal to Ulnar Styloid Process 25.5 cm   10 cm Proximal to Ulnar Styloid Process 21.5 cm   Just Proximal to Ulnar Styloid Process 17 cm   Across Hand at [employer] 20  cm   At [facility] of 2nd Digit 6.3 cm                  [facility] Adult PT Treatment/Exercise - 09/01/15 0001      Neck Exercises: Seated   Other Seated Exercise one rep of neck and upper thoracic range of motion     Manual Therapy   Manual Lymphatic Drainage (MLD) short neck including shoulder collectory, diphragmatic breathing with extra time as pt had diffiuclty performing, right inguinal nodes, right axillo-inguinal node anastamosis, left axillary nodes, anterior interaxillary anastamosis, right anterior and posterior shouler, right upper arm , lower arm and hand with return along pathways. Incorporated elevation and active exercise throughout.    Compression Bandaging biotone applied to right arm, thick stockinette, 2 elastomull to all 5 fingers, 2 artiflex with thin foam patch at antecubital fossa, 6cm bandage to hand, 8 cm to forearm, 10 and 12 cm from wrist to axilla                 PT Education - 09/01/15 0959    Education provided Yes   Education Details  take all bandages off if unable to perform job duties with them on and bring everything back to next treatment    Person(s) Educated Patient   Methods Explanation   Comprehension Verbalized understanding                [place] - 08/31/15 1223      CC Long Term Goal  #1   Title Patient will have a circumferential reduction of     2      cm at   10   cm above the   right      ulnar styloid    Baseline 28.5 on 08/31/2015   Time 4   Period Weeks   Status New     CC Long Term Goal  #2   Title Patient will report a decrease in pain by 50% so they can perform daily activities with greater ease   Baseline 9/10 on 08/31/2015   Time 4   Period Weeks   Status New     CC Long Term Goal  #3   Title Patient will be know how to obtain and use compression garments for maintenance phase of treatment   Time 4   Period Weeks     CC Long Term Goal  #4   Title Patient will be independent in self manual lymph drainage   Time 4   Period Weeks   Status New            Plan - 09/01/15 1000    Clinical Impression Statement Patient attentive throughout first treatment.  She is not sure she will be able to wear bandages at work, but will try today.  If not, will need to coordinate PT schedule with days off and measure when she is most reduced.    Clinical Impairments Affecting Rehab Potential previous 7 lymph nodes removed and upper quarter radiation   PT Next Visit Plan Assess effect of bandaging. Remeasure if indicated, continue with manual lymph drainage and compression bandaging. give flyer for ABC class      Patient will benefit from skilled therapeutic intervention in order to improve the following deficits and impairments:  Decreased range of motion, Increased fascial restricitons, Impaired UE functional use, Decreased knowledge of precautions, Pain, Decreased scar mobility, Decreased knowledge of use of DME, Decreased strength, Increased edema, Postural  dysfunction  Visit Diagnosis: Lymphedema, not elsewhere classified  Pain In Right Arm  Stiffness of right shoulder, not elsewhere classified     Problem List Patient Active Problem List   Diagnosis Date Noted  . Other and unspecified hyperlipidemia 04/09/2012  . Mitral valve regurgitation 04/09/2012  . Unspecified vitamin D deficiency 04/09/2012  . Routine general medical examination at a health care facility 03/15/2012  . Hypothyroid 02/14/2012  . HTN (hypertension) 02/14/2012  . Asthma 02/14/2012  . HX: breast cancer 02/14/2012   Donato Heinz. Owens Shark PT  Norwood Levo 09/01/2015, 10:06 AM  [place], [place], [address] Phone: [phone]   Fax:  [phone]  Name: VASTIE LADUKE MRN: [id number] Date of Birth: 1960-05-09

## 2015-09-02 ENCOUNTER — Ambulatory Visit: Payer: 59

## 2015-09-02 DIAGNOSIS — M25611 Stiffness of right shoulder, not elsewhere classified: Secondary | ICD-10-CM

## 2015-09-02 DIAGNOSIS — M79601 Pain in right arm: Secondary | ICD-10-CM | POA: Diagnosis not present

## 2015-09-02 DIAGNOSIS — I890 Lymphedema, not elsewhere classified: Secondary | ICD-10-CM

## 2015-09-02 NOTE — Therapy (Signed)
[place], [place], [address] Phone: [phone]   Fax:  [phone]  Physical Therapy Treatment  Patient Details  Name: Donna Pace MRN: [id number] Date of Birth: [birth year] Referring Provider: Dr. Charna Archer   Encounter Date: 09/02/2015      PT End of Session - 09/02/15 0851    Visit Number 3   Number of Visits 13   Date for PT Re-Evaluation 10/01/15   PT Start Time 0801   PT Stop Time [id number]   PT Time Calculation (min) 46 min   Activity Tolerance Patient tolerated treatment well   Behavior During Therapy [facility] for tasks assessed/performed      Past Medical History:  Diagnosis Date  . Asthma   . Cancer [facility]) 2004   breast cancer  . Hyperlipidemia   . Hypertension   . Thyroid disease    hypothyroidism  . Unspecified vitamin D deficiency 04/09/2012    Past Surgical History:  Procedure Laterality Date  . BREAST LUMPECTOMY  2004   right breast    There were no vitals filed for this visit.      Subjective Assessment - 09/02/15 0803    Subjective I had to take my bandages off for work but they stayed on until 5 last night before I went in for third shift. Feel like my arm went down some though.   Pertinent History Breast cancer with lumpectomy with 7 nodes removed with chemo and radiation in 2004. She notieced swelling in right arm about 7 years ago and never had any treatment.  Occasionally fullness in breast    Patient Stated Goals to get some help with her arm swelling.    Currently in Pain? No/denies               LYMPHEDEMA/ONCOLOGY QUESTIONNAIRE - 09/02/15 0804      Right Upper Extremity Lymphedema   15 cm Proximal to Olecranon Process 31.6 cm   10 cm Proximal to Olecranon Process 31.4 cm   Olecranon Process 29.8 cm   15 cm Proximal to Ulnar Styloid Process 31.9 cm   10 cm Proximal to Ulnar Styloid Process 28.8 cm   Just Proximal to Ulnar Styloid Process 18.8 cm    Across Hand at [employer] 22.4 cm   At [place] of 2nd Digit 7.4 cm                  [facility] Adult PT Treatment/Exercise - 09/02/15 0001      Manual Therapy   Manual Lymphatic Drainage (MLD) short neck including shoulder collectory, diphragmatic breathing with extra time as pt had diffiuclty performing, right inguinal nodes, right axillo-inguinal node anastamosis, left axillary nodes, anterior interaxillary anastamosis, right anterior and posterior shouler, right upper arm , lower arm and hand with return along pathways.   Compression Bandaging biotone applied to right arm, thick stockinette, 1 elastomull to fingers 1-4, 1 artiflex with thin foam patch at antecubital fossa, 6cm bandage to hand, 8 cm to forearm, 10 and 12 cm from wrist to axilla.                PT Education - 09/02/15 0850    Education provided Yes   Education Details Instructed pt in possibility of removing the last bandage (12 cm) from arm for work if it will help her to be able to fit her arm into her uniform, also did only 1 artiflex in hopes for the same.  Person(s) Educated Patient   Methods Explanation   Comprehension Verbalized understanding                [facility] Goals - 08/31/15 1223      CC Long Term Goal  #1   Title Patient will have a circumferential reduction of     2      cm at   10   cm above the   right      ulnar styloid    Baseline 28.5 on 08/31/2015   Time 4   Period Weeks   Status New     CC Long Term Goal  #2   Title Patient will report a decrease in pain by 50% so they can perform daily activities with greater ease   Baseline 9/10 on 08/31/2015   Time 4   Period Weeks   Status New     CC Long Term Goal  #3   Title Patient will be know how to obtain and use compression garments for maintenance phase of treatment   Time 4   Period Weeks     CC Long Term Goal  #4   Title Patient will be independent in self manual lymph drainage   Time 4   Period  Weeks   Status New            Plan - 09/02/15 [id number]    Clinical Impression Statement Pt tolerated wearing bandages well but was not able to keep them on for work as she couldn't fit her arm into her uniform sleeve. So used 1 less Artiflex today and instructed pt that it that wasn't enough she could also try removing 12 cm bandage and then replacing after work. Her circumference measurements had reduced well at some locations of hand, wrist and upper arm, but no changes at forearm which is where pt has most swelling. Overall she was pleased with results of first time being bandaged and will attempt to rewrap her arm if she feels comfortable per handout before next visit 09/07/15.   Rehab Potential Good   Clinical Impairments Affecting Rehab Potential previous 7 lymph nodes removed and upper quarter radiation   PT Frequency 3x / week   PT Duration 4 weeks   PT Treatment/Interventions ADLs/Self Care Home Management;Therapeutic activities;Taping;Therapeutic exercise;DME Instruction;Neuromuscular re-education;Manual techniques;Manual lymph drainage;Patient/family education;Passive range of motion;Scar mobilization   PT Next Visit Plan Assess if pt was able to bandage herself and/or keep them on for work with new way bandaged today. Continue with complete decongestive therapy. Give flyer for ABC class   Consulted and Agree with Plan of Care Patient      Patient will benefit from skilled therapeutic intervention in order to improve the following deficits and impairments:  Decreased range of motion, Increased fascial restricitons, Impaired UE functional use, Decreased knowledge of precautions, Pain, Decreased scar mobility, Decreased knowledge of use of DME, Decreased strength, Increased edema, Postural dysfunction  Visit Diagnosis: Lymphedema, not elsewhere classified  Pain In Right Arm  Stiffness of right shoulder, not elsewhere classified     Problem List Patient Active Problem List    Diagnosis Date Noted  . Other and unspecified hyperlipidemia 04/09/2012  . Mitral valve regurgitation 04/09/2012  . Unspecified vitamin D deficiency 04/09/2012  . Routine general medical examination at a health care facility 03/15/2012  . Hypothyroid 02/14/2012  . HTN (hypertension) 02/14/2012  . Asthma 02/14/2012  . HX: breast cancer 02/14/2012    Otelia Limes, PTA 09/02/2015,  10:21 AM  [place], [place], [address] Phone: [phone]   Fax:  [phone]  Name: Donna Pace MRN: [id number] Date of Birth: 10-09-1960

## 2015-09-07 ENCOUNTER — Encounter: Payer: Self-pay | Admitting: Physical Therapy

## 2015-09-07 ENCOUNTER — Ambulatory Visit: Payer: 59 | Attending: Surgery | Admitting: Physical Therapy

## 2015-09-07 DIAGNOSIS — I890 Lymphedema, not elsewhere classified: Secondary | ICD-10-CM | POA: Diagnosis present

## 2015-09-07 DIAGNOSIS — M79601 Pain in right arm: Secondary | ICD-10-CM | POA: Insufficient documentation

## 2015-09-07 NOTE — Therapy (Addendum)
[place], [place], [address] Phone: [phone]   Fax:  [phone]  Physical Therapy Treatment  Patient Details  Name: Donna Pace MRN: [id number] Date of Birth: 02/14/1960 Referring Provider: Dr. Charna Archer   Encounter Date: 09/07/2015      PT End of Session - 09/07/15 1139    Visit Number 4   Number of Visits 13   Date for PT Re-Evaluation 10/01/15   PT Start Time 0800   PT Stop Time 0850   PT Time Calculation (min) 50 min   Activity Tolerance Patient tolerated treatment well   Behavior During Therapy [facility] for tasks assessed/performed      Past Medical History:  Diagnosis Date  . Asthma   . Cancer [facility]) 2004   breast cancer  . Hyperlipidemia   . Hypertension   . Thyroid disease    hypothyroidism  . Unspecified vitamin D deficiency 04/09/2012    Past Surgical History:  Procedure Laterality Date  . BREAST LUMPECTOMY  2004   right breast    There were no vitals filed for this visit.      Subjective Assessment - 09/07/15 0804    Subjective Pt states her bandages still would not fit under her uniform. Pt states she thinks if there were less bandages it would. Yesterday I tried to bandage it up myself and I didn't put all the stuff on and it was still kind of puffy. Pt states she used the stockinette with 1 bandage.    Pertinent History Breast cancer with lumpectomy with 7 nodes removed with chemo and radiation in 2004. She notieced swelling in right arm about 7 years ago and never had any treatment.  Occasionally fullness in breast    Patient Stated Goals to get some help with her arm swelling.    Currently in Pain? No/denies   Pain Score 0-No pain                         [facility] Adult PT Treatment/Exercise - 09/07/15 0001      Manual Therapy   Manual Lymphatic Drainage (MLD) short neck including shoulder collectory, diphragmatic breathing with extra time as pt  had diffiuclty performing, right inguinal nodes, right axillo-inguinal node anastamosis, left axillary nodes, anterior interaxillary anastamosis, right anterior and posterior shouler, right upper arm , lower arm and hand with return along pathways.   Compression Bandaging biotone applied to right arm, thick stockinette, 1 elastomull to fingers 1-4, 1 artiflex with thin foam patch at antecubital fossa, 6cm bandage to hand, 8 cm to forearm, 10 and 12 cm from wrist to axilla.                        [facility] Goals - 08/31/15 1223      CC Long Term Goal  #1   Title Patient will have a circumferential reduction of     2      cm at   10   cm above the   right      ulnar styloid    Baseline 28.5 on 08/31/2015   Time 4   Period Weeks   Status New     CC Long Term Goal  #2   Title Patient will report a decrease in pain by 50% so they can perform daily activities with greater ease   Baseline 9/10 on 08/31/2015   Time  4   Period Weeks   Status New     CC Long Term Goal  #3   Title Patient will be know how to obtain and use compression garments for maintenance phase of treatment   Time 4   Period Weeks     CC Long Term Goal  #4   Title Patient will be independent in self manual lymph drainage   Time 4   Period Weeks   Status New            Plan - 09/07/15 1139    Clinical Impression Statement Pt states she was unable to wear her bandages at work. She did not attempt to remove just the top layer bandage and was encouraged to do so to see if this improves fit of uniform. She attempted to apply bandages herself but only used TG soft and one bandage. Pt educated that this will not control her edema. She continues to demonstrate considerable swelling at forearm and may benefit from peach dot foam to help decrease edema. She was given flyer for ABC class   Clinical Impairments Affecting Rehab Potential previous 7 lymph nodes removed and upper quarter radiation   PT  Frequency 3x / week   PT Duration 4 weeks   PT Treatment/Interventions ADLs/Self Care Home Management;Therapeutic activities;Taping;Therapeutic exercise;DME Instruction;Neuromuscular re-education;Manual techniques;Manual lymph drainage;Patient/family education;Passive range of motion;Scar mobilization   PT Next Visit Plan Assess if pt was able to bandage herself and/or keep them on for work with new way bandaged today. Continue with complete decongestive therapy.    Consulted and Agree with Plan of Care Patient      Patient will benefit from skilled therapeutic intervention in order to improve the following deficits and impairments:  Decreased range of motion, Increased fascial restricitons, Impaired UE functional use, Decreased knowledge of precautions, Pain, Decreased scar mobility, Decreased knowledge of use of DME, Decreased strength, Increased edema, Postural dysfunction  Visit Diagnosis: Lymphedema, not elsewhere classified  Pain In Right Arm     Problem List Patient Active Problem List   Diagnosis Date Noted  . Other and unspecified hyperlipidemia 04/09/2012  . Mitral valve regurgitation 04/09/2012  . Unspecified vitamin D deficiency 04/09/2012  . Routine general medical examination at a health care facility 03/15/2012  . Hypothyroid 02/14/2012  . HTN (hypertension) 02/14/2012  . Asthma 02/14/2012  . HX: breast cancer 02/14/2012    Alexia Freestone 09/07/2015, 11:42 AM  [place], [place], [address] Phone: [phone]   Fax:  [phone]  Name: Donna Pace MRN: [id number] Date of Birth: November 15, 1960   Allyson Sabal, PT 09/07/15 11:42 AM  PHYSICAL THERAPY DISCHARGE SUMMARY  Visits from Start of Care: 4  Current functional level related to goals / functional outcomes: See above  Remaining deficits: See above   Education / Equipment: Importance of bandaging for treatment of  lymphedema Plan: Patient agrees to discharge.  Patient goals were not met. Patient is being discharged due to not returning since the last visit.  ?????     Allyson Sabal [place], [place] 02/28/17 9:56 AM

## 2015-09-13 ENCOUNTER — Ambulatory Visit: Payer: 59

## 2015-09-15 ENCOUNTER — Ambulatory Visit: Payer: 59 | Admitting: Physical Therapy

## 2015-09-17 ENCOUNTER — Ambulatory Visit: Payer: 59 | Admitting: Physical Therapy

## 2015-09-20 ENCOUNTER — Ambulatory Visit: Payer: 59

## 2015-09-22 ENCOUNTER — Encounter: Payer: 59 | Admitting: Physical Therapy

## 2015-09-24 ENCOUNTER — Encounter: Payer: 59 | Admitting: Physical Therapy

## 2016-11-09 ENCOUNTER — Encounter (HOSPITAL_BASED_OUTPATIENT_CLINIC_OR_DEPARTMENT_OTHER): Payer: Self-pay | Admitting: *Deleted

## 2016-11-09 ENCOUNTER — Emergency Department (HOSPITAL_BASED_OUTPATIENT_CLINIC_OR_DEPARTMENT_OTHER)
Admission: EM | Admit: 2016-11-09 | Discharge: 2016-11-10 | Disposition: A | Discharge status: Home / Self Care | Payer: Worker's Compensation | Attending: Emergency Medicine | Admitting: Emergency Medicine

## 2016-11-09 DIAGNOSIS — F17210 Nicotine dependence, cigarettes, uncomplicated: Secondary | ICD-10-CM | POA: Insufficient documentation

## 2016-11-09 DIAGNOSIS — Y929 Unspecified place or not applicable: Secondary | ICD-10-CM | POA: Diagnosis not present

## 2016-11-09 DIAGNOSIS — S6992XA Unspecified injury of left wrist, hand and finger(s), initial encounter: Secondary | ICD-10-CM | POA: Diagnosis present

## 2016-11-09 DIAGNOSIS — Y939 Activity, unspecified: Secondary | ICD-10-CM | POA: Insufficient documentation

## 2016-11-09 DIAGNOSIS — W260XXA Contact with knife, initial encounter: Secondary | ICD-10-CM | POA: Insufficient documentation

## 2016-11-09 DIAGNOSIS — J45909 Unspecified asthma, uncomplicated: Secondary | ICD-10-CM | POA: Diagnosis not present

## 2016-11-09 DIAGNOSIS — Z79899 Other long term (current) drug therapy: Secondary | ICD-10-CM | POA: Diagnosis not present

## 2016-11-09 DIAGNOSIS — S61012A Laceration without foreign body of left thumb without damage to nail, initial encounter: Secondary | ICD-10-CM | POA: Insufficient documentation

## 2016-11-09 DIAGNOSIS — I10 Essential (primary) hypertension: Secondary | ICD-10-CM | POA: Insufficient documentation

## 2016-11-09 DIAGNOSIS — Y999 Unspecified external cause status: Secondary | ICD-10-CM | POA: Insufficient documentation

## 2016-11-09 NOTE — ED Triage Notes (Signed)
Laceration to her left thumb on a utility knife at work tonight. Bleeding controlled. Workman's comp. UDS needed.

## 2016-11-09 NOTE — ED Notes (Signed)
Pt thumb irrigated with sterile water

## 2016-11-10 MED ORDER — LIDOCAINE HCL 2 % IJ SOLN
10.0000 mL | Freq: Once | INTRAMUSCULAR | Status: DC
  Filled 2016-11-10: qty 20

## 2016-11-10 NOTE — Discharge Instructions (Signed)
Stitches need to be removed in 7 days, That can be done at your doctor's office, an urgent care center, or in the emergency department.

## 2016-11-10 NOTE — ED Notes (Signed)
ED Provider at bedside. 

## 2016-11-10 NOTE — ED Provider Notes (Signed)
[facility]PT [facility] Provider Note   CSN: [id number] Arrival date & time: 11/09/16  2253     History   Chief Complaint Chief Complaint  Patient presents with  . Finger Injury    HPI Donna Pace is a 56 y.o. female.  The history is provided by the patient.  She cut her left thumb while at work. She actually cut it with a box cutter. Last tetanus immunization was 4 years ago.  Past Medical History:  Diagnosis Date  . Asthma   . Cancer [facility]) 2004   breast cancer  . Hyperlipidemia   . Hypertension   . Thyroid disease    hypothyroidism  . Unspecified vitamin D deficiency 04/09/2012    Patient Active Problem List   Diagnosis Date Noted  . Other and unspecified hyperlipidemia 04/09/2012  . Mitral valve regurgitation 04/09/2012  . Unspecified vitamin D deficiency 04/09/2012  . Routine general medical examination at a health care facility 03/15/2012  . Hypothyroid 02/14/2012  . HTN (hypertension) 02/14/2012  . Asthma 02/14/2012  . HX: breast cancer 02/14/2012    Past Surgical History:  Procedure Laterality Date  . BREAST LUMPECTOMY  2004   right breast    OB History    No data available       Home Medications    Prior to Admission medications   Medication Sig Start Date End Date Taking? Authorizing Provider  albuterol (VENTOLIN HFA) 108 (90 BASE) MCG/ACT inhaler Inhale 2 puffs into the lungs every 6 (six) hours as needed. 07/23/12  Yes Debbrah Alar, NP  Calcium Carbonate-Vitamin D (CALTRATE 600+D) 600-400 MG-UNIT per tablet Take 1 tablet by mouth 2 (two) times daily. 03/15/12  Yes Debbrah Alar, NP  levothyroxine (SYNTHROID, LEVOTHROID) 125 MCG tablet Take 1 tablet (125 mcg total) by mouth daily. 06/05/12  Yes Debbrah Alar, NP  lisinopril-hydrochlorothiazide (PRINZIDE,ZESTORETIC) 20-25 MG per tablet Take 1 tablet by mouth daily. 02/14/12   Debbrah Alar, NP    Family History Family History  Problem Relation Age of Onset  . Diabetes  Mother   . Hypertension Mother   . Diabetes Father   . Cancer Sister        bone cancer    Social History Social History  Substance Use Topics  . Smoking status: Current Every Day Smoker    Types: Cigarettes  . Smokeless tobacco: Never Used     Comment: 5 cigarettes daily  . Alcohol use No     Allergies   Penicillins   Review of Systems Review of Systems  All other systems reviewed and are negative.    Physical Exam Updated Vital Signs BP (!) 177/85   Pulse 60   Temp 97.9 F (36.6 C) (Oral)   Resp 18   Ht 5\' 6"  (1.676 m)   Wt 77.1 kg (170 lb)   LMP 02/06/2006   SpO2 100%   BMI 27.44 kg/m   Physical Exam  Nursing note and vitals reviewed.  56 year old female, resting comfortably and in no acute distress. Vital signs are significant for hypertension. Oxygen saturation is 100%, which is normal. Head is normocephalic and atraumatic. PERRLA, EOMI. Oropharynx is clear. Neck is nontender and supple without adenopathy or JVD. Back is nontender and there is no CVA tenderness. Lungs are clear without rales, wheezes, or rhonchi. Chest is nontender. Heart has regular rate and rhythm without murmur. Abdomen is soft, flat, nontender without masses or hepatosplenomegaly and peristalsis is normoactive. Extremities: Laceration of the tip of the  left thumb extending to the nail.. Skin is warm and dry without rash. Neurologic: Mental status is normal, cranial nerves are intact, there are no motor or sensory deficits.  ED Treatments / Results   Procedures Procedures (including critical care time) LACERATION REPAIR Performed by: VWAQL,RJPVG Authorized by: KKDPT,ELMRA Consent: Verbal consent obtained. Risks and benefits: risks, benefits and alternatives were discussed Consent given by: patient Patient identity confirmed: provided demographic data Prepped and Draped in normal sterile fashion Wound explored  Laceration Location: Left thumb  Laceration Length: 1.5  cm  No Foreign Bodies seen or palpated  Anesthesia: Digital block with 2% lidocaine wihtout epinephrine  Anesthetic total: 5 ml  Amount of cleaning: standard  Skin closure: Close  Number of sutures: 3  Technique: Simple interrupted with 5-0 Nylon  Patient tolerance: Patient tolerated the procedure well with no immediate complications.   Medications Ordered in ED Medications - No data to display   Initial Impression / Assessment and Plan / ED Course  I have reviewed the triage vital signs and the nursing notes.  Laceration of the left thumb which is treated with suture closure. Old records are reviewed confirming Tdap booster in 2014.  Final Clinical Impressions(s) / ED Diagnoses   Final diagnoses:  Laceration of left thumb, initial encounter    New Prescriptions New Prescriptions   No medications on file     Delora Fuel, MD 15/18/34 [phone]

## 2016-12-11 MED ORDER — ACETAMINOPHEN 325 MG PO TABS
650.00 mg | ORAL_TABLET | ORAL | Status: DC
Start: ? — End: 2016-12-11

## 2016-12-11 MED ORDER — SODIUM CHLORIDE 0.9 % IJ SOLN
10.00 mL | INTRAMUSCULAR | Status: DC
Start: ? — End: 2016-12-11

## 2016-12-11 MED ORDER — ONDANSETRON HCL 4 MG/2ML IJ SOLN
4.00 mg | INTRAMUSCULAR | Status: DC
Start: ? — End: 2016-12-11

## 2016-12-11 MED ORDER — ENOXAPARIN SODIUM 40 MG/0.4ML ~~LOC~~ SOLN
40.00 mg | SUBCUTANEOUS | Status: DC
Start: 2016-12-11 — End: 2016-12-11

## 2016-12-11 MED ORDER — LEVOTHYROXINE SODIUM 125 MCG PO TABS
125.00 | ORAL_TABLET | ORAL | Status: DC
Start: 2016-12-12 — End: 2016-12-11

## 2016-12-11 MED ORDER — MONTELUKAST SODIUM 10 MG PO TABS
10.00 mg | ORAL_TABLET | ORAL | Status: DC
Start: 2016-12-12 — End: 2016-12-11

## 2016-12-11 MED ORDER — ASPIRIN EC 81 MG PO TBEC
81.00 mg | DELAYED_RELEASE_TABLET | ORAL | Status: DC
Start: 2016-12-12 — End: 2016-12-11

## 2016-12-11 MED ORDER — GENERIC EXTERNAL MEDICATION
Status: DC
Start: ? — End: 2016-12-11

## 2016-12-11 MED ORDER — HYDRALAZINE HCL 25 MG PO TABS
25.00 mg | ORAL_TABLET | ORAL | Status: DC
Start: ? — End: 2016-12-11

## 2016-12-11 MED ORDER — SODIUM CHLORIDE 0.9 % IJ SOLN
10.00 mL | INTRAMUSCULAR | Status: DC
Start: 2016-12-11 — End: 2016-12-11

## 2016-12-11 MED ORDER — DIPHENHYDRAMINE HCL 25 MG PO CAPS
25.00 mg | ORAL_CAPSULE | ORAL | Status: DC
Start: ? — End: 2016-12-11

## 2016-12-11 MED ORDER — AMLODIPINE BESYLATE 5 MG PO TABS
10.00 mg | ORAL_TABLET | ORAL | Status: DC
Start: 2016-12-12 — End: 2016-12-11

## 2016-12-11 MED ORDER — ALBUTEROL SULFATE HFA 108 (90 BASE) MCG/ACT IN AERS
2.00 | INHALATION_SPRAY | RESPIRATORY_TRACT | Status: DC
Start: ? — End: 2016-12-11

## 2016-12-11 MED ORDER — HYDRALAZINE HCL 20 MG/ML IJ SOLN
10.00 mg | INTRAMUSCULAR | Status: DC
Start: ? — End: 2016-12-11

## 2016-12-11 MED ORDER — LOSARTAN POTASSIUM 50 MG PO TABS
100.00 mg | ORAL_TABLET | ORAL | Status: DC
Start: 2016-12-12 — End: 2016-12-11

## 2016-12-11 MED ORDER — PREDNISONE 20 MG PO TABS
40.00 mg | ORAL_TABLET | ORAL | Status: DC
Start: 2016-12-12 — End: 2016-12-11

## 2016-12-11 MED ORDER — GUAIFENESIN ER 600 MG PO TB12
600.00 mg | ORAL_TABLET | ORAL | Status: DC
Start: 2016-12-11 — End: 2016-12-11

## 2016-12-11 MED ORDER — FLUTICASONE FUROATE-VILANTEROL 100-25 MCG/INH IN AEPB
1.00 | INHALATION_SPRAY | RESPIRATORY_TRACT | Status: DC
Start: 2016-12-12 — End: 2016-12-11

## 2016-12-11 MED ORDER — HYDROCHLOROTHIAZIDE 12.5 MG PO CAPS
12.50 mg | ORAL_CAPSULE | ORAL | Status: DC
Start: 2016-12-12 — End: 2016-12-11

## 2020-05-14 ENCOUNTER — Emergency Department (HOSPITAL_BASED_OUTPATIENT_CLINIC_OR_DEPARTMENT_OTHER): Payer: Managed Care, Other (non HMO)

## 2020-05-14 ENCOUNTER — Encounter (HOSPITAL_BASED_OUTPATIENT_CLINIC_OR_DEPARTMENT_OTHER): Payer: Self-pay | Admitting: *Deleted

## 2020-05-14 ENCOUNTER — Other Ambulatory Visit: Payer: Self-pay

## 2020-05-14 ENCOUNTER — Inpatient Hospital Stay (HOSPITAL_BASED_OUTPATIENT_CLINIC_OR_DEPARTMENT_OTHER)
Admission: EM | Admit: 2020-05-14 | Discharge: 2020-05-17 | DRG: 872 | Disposition: A | Discharge status: Home / Self Care | Payer: Managed Care, Other (non HMO) | Attending: Internal Medicine | Admitting: Internal Medicine

## 2020-05-14 DIAGNOSIS — Z66 Do not resuscitate: Secondary | ICD-10-CM | POA: Diagnosis present

## 2020-05-14 DIAGNOSIS — Z853 Personal history of malignant neoplasm of breast: Secondary | ICD-10-CM

## 2020-05-14 DIAGNOSIS — Z79890 Hormone replacement therapy (postmenopausal): Secondary | ICD-10-CM

## 2020-05-14 DIAGNOSIS — Z8249 Family history of ischemic heart disease and other diseases of the circulatory system: Secondary | ICD-10-CM

## 2020-05-14 DIAGNOSIS — I10 Essential (primary) hypertension: Secondary | ICD-10-CM | POA: Diagnosis present

## 2020-05-14 DIAGNOSIS — E039 Hypothyroidism, unspecified: Secondary | ICD-10-CM | POA: Diagnosis present

## 2020-05-14 DIAGNOSIS — Z888 Allergy status to other drugs, medicaments and biological substances status: Secondary | ICD-10-CM

## 2020-05-14 DIAGNOSIS — A419 Sepsis, unspecified organism: Secondary | ICD-10-CM | POA: Diagnosis present

## 2020-05-14 DIAGNOSIS — E785 Hyperlipidemia, unspecified: Secondary | ICD-10-CM | POA: Diagnosis present

## 2020-05-14 DIAGNOSIS — Z87891 Personal history of nicotine dependence: Secondary | ICD-10-CM

## 2020-05-14 DIAGNOSIS — R600 Localized edema: Secondary | ICD-10-CM

## 2020-05-14 DIAGNOSIS — L03113 Cellulitis of right upper limb: Secondary | ICD-10-CM | POA: Diagnosis present

## 2020-05-14 DIAGNOSIS — I700 Atherosclerosis of aorta: Secondary | ICD-10-CM

## 2020-05-14 DIAGNOSIS — Z20822 Contact with and (suspected) exposure to covid-19: Secondary | ICD-10-CM | POA: Diagnosis present

## 2020-05-14 DIAGNOSIS — R609 Edema, unspecified: Secondary | ICD-10-CM | POA: Diagnosis present

## 2020-05-14 DIAGNOSIS — Z79899 Other long term (current) drug therapy: Secondary | ICD-10-CM

## 2020-05-14 DIAGNOSIS — Z880 Allergy status to penicillin: Secondary | ICD-10-CM

## 2020-05-14 DIAGNOSIS — J449 Chronic obstructive pulmonary disease, unspecified: Secondary | ICD-10-CM | POA: Diagnosis present

## 2020-05-14 DIAGNOSIS — L0390 Cellulitis, unspecified: Secondary | ICD-10-CM

## 2020-05-14 DIAGNOSIS — J45909 Unspecified asthma, uncomplicated: Secondary | ICD-10-CM | POA: Diagnosis present

## 2020-05-14 DIAGNOSIS — I340 Nonrheumatic mitral (valve) insufficiency: Secondary | ICD-10-CM | POA: Diagnosis present

## 2020-05-14 LAB — COMPREHENSIVE METABOLIC PANEL
ALT: 52 U/L — ABNORMAL HIGH (ref 0–44)
AST: 37 U/L (ref 15–41)
Albumin: 3.9 g/dL (ref 3.5–5.0)
Alkaline Phosphatase: 70 U/L (ref 38–126)
Anion gap: 12 (ref 5–15)
BUN: 13 mg/dL (ref 8–23)
CO2: 26 mmol/L (ref 22–32)
Calcium: 9.4 mg/dL (ref 8.9–10.3)
Chloride: 100 mmol/L (ref 98–111)
Creatinine, Ser: 1.02 mg/dL — ABNORMAL HIGH (ref 0.44–1.00)
GFR, Estimated: >60 mL/min (ref >60)
Glucose, Bld: 106 mg/dL — ABNORMAL HIGH (ref 70–99)
Potassium: 3.3 mmol/L — ABNORMAL LOW (ref 3.5–5.1)
Sodium: 138 mmol/L (ref 135–145)
Total Bilirubin: 0.4 mg/dL (ref 0.3–1.2)
Total Protein: 7.4 g/dL (ref 6.5–8.1)

## 2020-05-14 LAB — D-DIMER, QUANTITATIVE: D-Dimer, Quant: 3.44 ug/mL-FEU — ABNORMAL HIGH (ref 0.00–0.50)

## 2020-05-14 LAB — CBC WITH DIFFERENTIAL/PLATELET
Abs Immature Granulocytes: 0.06 10*3/uL (ref 0.00–0.07)
Basophils Absolute: 0 10*3/uL (ref 0.0–0.1)
Basophils Relative: 0 %
Eosinophils Absolute: 0.2 10*3/uL (ref 0.0–0.5)
Eosinophils Relative: 1 %
HCT: 42.6 % (ref 36.0–46.0)
Hemoglobin: 14.4 g/dL (ref 12.0–15.0)
Immature Granulocytes: 1 %
Lymphocytes Relative: 13 %
Lymphs Abs: 1.7 10*3/uL (ref 0.7–4.0)
MCH: 29.9 pg (ref 26.0–34.0)
MCHC: 33.8 g/dL (ref 30.0–36.0)
MCV: 88.6 fL (ref 80.0–100.0)
Monocytes Absolute: 0.4 10*3/uL (ref 0.1–1.0)
Monocytes Relative: 3 %
Neutro Abs: 10.2 10*3/uL — ABNORMAL HIGH (ref 1.7–7.7)
Neutrophils Relative %: 82 %
Platelets: 230 10*3/uL (ref 150–400)
RBC: 4.81 MIL/uL (ref 3.87–5.11)
RDW: 11.9 % (ref 11.5–15.5)
WBC: 12.5 10*3/uL — ABNORMAL HIGH (ref 4.0–10.5)
nRBC: 0 % (ref 0.0–0.2)

## 2020-05-14 LAB — LACTIC ACID, PLASMA: Lactic Acid, Venous: 1.7 mmol/L (ref 0.5–1.9)

## 2020-05-14 LAB — TROPONIN I (HIGH SENSITIVITY): Troponin I (High Sensitivity): 7 ng/L (ref <18)

## 2020-05-14 MED ORDER — ACETAMINOPHEN 325 MG PO TABS
650.0000 mg | ORAL_TABLET | Freq: Once | ORAL | Status: AC
  Administered 2020-05-14: 650 mg via ORAL
  Filled 2020-05-14: qty 2

## 2020-05-14 MED ORDER — IOHEXOL 350 MG/ML SOLN
100.0000 mL | Freq: Once | INTRAVENOUS | Status: AC | PRN
  Administered 2020-05-14: 100 mL via INTRAVENOUS

## 2020-05-14 NOTE — ED Triage Notes (Signed)
C/o SOB x 2 hrs hx asthma , COPD

## 2020-05-14 NOTE — ED Notes (Signed)
ED Provider at bedside. 

## 2020-05-14 NOTE — ED Notes (Signed)
PCXR obtained.

## 2020-05-14 NOTE — ED Notes (Addendum)
Pt moved  from triage to rm 6

## 2020-05-14 NOTE — ED Provider Notes (Signed)
[facility] Provider Note   CSN: [id number] Arrival date & time: 05/14/20  2108     History Chief Complaint  Patient presents with  . Shortness of Breath    Donna Pace is a 60 y.o. female past medical history of hyperlipidemia, hypertension, breast cancer, asthma who presents for evaluation of shortness of breath she reports she was at work earlier this afternoon.  She had an episode where she felt lightheaded, warm, dizzy.  She states she then started feel short of breath.  She reports that when this happened, she fell like it hurt in her chest to breathe.  She went to the bathroom to have one-time episode of vomiting.  She reports feeling diaphoretic at the time.  Use her inhaler x2 and reported some improvement in shortness of breath.  She said that it felt slightly different than her previous asthma attack so she came to the emergency department for further evaluation.  She has a history of right upper extremity lymphedema from previous breast cancer diagnosis.  She states that sometimes her arm will get hot.  She does feel like it is more warm, erythematous at this time.  She states she has felt little bit tired and fatigued over the last 2 days but denies any recent fevers, cough, congestion.  Denies any abdominal pain, no leg swelling.  She does not smoke.  She has a history of hypertension.  No history of diabetes.  No personal cardiac history.  No family history of MI before the age of 51.  The history is provided by the patient.       Past Medical History:  Diagnosis Date  . Asthma   . Cancer [facility]) 2004   breast cancer  . Hyperlipidemia   . Hypertension   . Thyroid disease    hypothyroidism  . Unspecified vitamin D deficiency 04/09/2012    Patient Active Problem List   Diagnosis Date Noted  . Other and unspecified hyperlipidemia 04/09/2012  . Mitral valve regurgitation 04/09/2012  . Unspecified vitamin D deficiency 04/09/2012  . Routine  general medical examination at a health care facility 03/15/2012  . Hypothyroid 02/14/2012  . HTN (hypertension) 02/14/2012  . Asthma 02/14/2012  . HX: breast cancer 02/14/2012    Past Surgical History:  Procedure Laterality Date  . BREAST LUMPECTOMY  2004   right breast     OB History   No obstetric history on file.     Family History  Problem Relation Age of Onset  . Diabetes Mother   . Hypertension Mother   . Diabetes Father   . Cancer Sister        bone cancer    Social History   Tobacco Use  . Smoking status: Former Smoker    Types: Cigarettes  . Smokeless tobacco: Never Used  . Tobacco comment: 5 cigarettes daily  Substance Use Topics  . Alcohol use: No    Home Medications Prior to Admission medications   Medication Sig Start Date End Date Taking? Authorizing Provider  albuterol (VENTOLIN HFA) 108 (90 BASE) MCG/ACT inhaler Inhale 2 puffs into the lungs every 6 (six) hours as needed. 07/23/12   Debbrah Alar, NP  Calcium Carbonate-Vitamin D (CALTRATE 600+D) 600-400 MG-UNIT per tablet Take 1 tablet by mouth 2 (two) times daily. 03/15/12   Debbrah Alar, NP  levothyroxine (SYNTHROID, LEVOTHROID) 125 MCG tablet Take 1 tablet (125 mcg total) by mouth daily. 06/05/12   Debbrah Alar, NP  lisinopril-hydrochlorothiazide ([place]) 20-25  MG per tablet Take 1 tablet by mouth daily. 02/14/12   Debbrah Alar, NP    Allergies    Ace inhibitors and Penicillins  Review of Systems   Review of Systems  Constitutional: Negative for fever.  Respiratory: Positive for shortness of breath. Negative for cough.   Cardiovascular: Positive for chest pain.  Gastrointestinal: Positive for vomiting. Negative for abdominal pain and nausea.  Genitourinary: Negative for dysuria and hematuria.  Neurological: Negative for headaches.  All other systems reviewed and are negative.   Physical Exam Updated Vital Signs BP 106/78 (BP Location: Left Arm)    Pulse (!) 108   Temp 99.2 F (37.3 C) (Oral)   Resp 18   Ht 5\' 6"  (1.676 m)   Wt 79.4 kg   LMP 02/06/2006   SpO2 100%   BMI 28.25 kg/m   Physical Exam Vitals and nursing note reviewed.  Constitutional:      Appearance: Normal appearance. She is well-developed.  HENT:     Head: Normocephalic and atraumatic.  Eyes:     General: Lids are normal.     Conjunctiva/sclera: Conjunctivae normal.     Pupils: Pupils are equal, round, and reactive to light.  Cardiovascular:     Rate and Rhythm: Regular rhythm. Tachycardia present.     Pulses: Normal pulses.          Radial pulses are 2+ on the right side and 2+ on the left side.       Dorsalis pedis pulses are 2+ on the right side and 2+ on the left side.     Heart sounds: Normal heart sounds. No murmur heard. No friction rub. No gallop.   Pulmonary:     Effort: Pulmonary effort is normal. Tachypnea present.     Breath sounds: Normal breath sounds.     Comments: Tachypnea noted.  Patient with some increased work of breathing.  No obvious wheezing.  No evidence of respiratory stress.  Able to speak in full sentences without any difficulty. Abdominal:     Palpations: Abdomen is soft. Abdomen is not rigid.     Tenderness: There is no abdominal tenderness. There is no guarding.     Comments: Abdomen is soft, non-distended, non-tender. No rigidity, No guarding. No peritoneal signs.  Musculoskeletal:        General: Normal range of motion.     Cervical back: Full passive range of motion without pain.  Skin:    General: Skin is warm and dry.     Capillary Refill: Capillary refill takes less than 2 seconds.     Comments: Right upper extremity is edematous, erythematous, warm to the touch. Good distal cap refill. RUE is not dusky in appearance or cool to touch.  Neurological:     Mental Status: She is alert and oriented to person, place, and time.  Psychiatric:        Speech: Speech normal.     ED Results / Procedures / Treatments    Labs (all labs ordered are listed, but only abnormal results are displayed) Labs Reviewed  COMPREHENSIVE METABOLIC PANEL - Abnormal; Notable for the following components:      Result Value   Potassium 3.3 (*)    Glucose, Bld 106 (*)    Creatinine, Ser 1.02 (*)    ALT 52 (*)    All other components within normal limits  CBC WITH DIFFERENTIAL/PLATELET - Abnormal; Notable for the following components:   WBC 12.5 (*)    Neutro Abs 10.2 (*)  All other components within normal limits  D-DIMER, QUANTITATIVE - Abnormal; Notable for the following components:   D-Dimer, Quant 3.44 (*)    All other components within normal limits  URINALYSIS, ROUTINE W REFLEX MICROSCOPIC - Abnormal; Notable for the following components:   Specific Gravity, Urine <1.005 (*)    All other components within normal limits  RESP PANEL BY RT-PCR (FLU A&B, COVID) ARPGX2  LACTIC ACID, PLASMA  LACTIC ACID, PLASMA  TROPONIN I (HIGH SENSITIVITY)  TROPONIN I (HIGH SENSITIVITY)    EKG None  Radiology CT Angio Chest PE W and/or Wo Contrast  Result Date: 05/14/2020 CLINICAL DATA:  Positive D-dimer.  Shortness of breath. EXAM: CT ANGIOGRAPHY CHEST WITH CONTRAST TECHNIQUE: Multidetector CT imaging of the chest was performed using the standard protocol during bolus administration of intravenous contrast. Multiplanar CT image reconstructions and MIPs were obtained to evaluate the vascular anatomy. CONTRAST:  110mL OMNIPAQUE IOHEXOL 350 MG/ML SOLN COMPARISON:  None. FINDINGS: Cardiovascular: Contrast injection is sufficient to demonstrate satisfactory opacification of the pulmonary arteries to the segmental level. There is no pulmonary embolus or evidence of right heart strain. The size of the main pulmonary artery is normal. Heart size is normal, with no pericardial effusion. The course and caliber of the aorta are normal. There is mild atherosclerotic calcification. Opacification decreased due to pulmonary arterial phase  contrast bolus timing. Mediastinum/Nodes: No mediastinal, hilar or axillary lymphadenopathy. Normal visualized thyroid. Thoracic esophageal course is normal. Lungs/Pleura: Airways are patent. No pleural effusion, lobar consolidation, pneumothorax or pulmonary infarction. Upper Abdomen: Contrast bolus timing is not optimized for evaluation of the abdominal organs. The visualized portions of the organs of the upper abdomen are normal. Musculoskeletal: No chest wall abnormality. No bony spinal canal stenosis. Review of the MIP images confirms the above findings. IMPRESSION: 1. No pulmonary embolus or other acute thoracic abnormality. Aortic Atherosclerosis (ICD10-I70.0). Electronically Signed   By: Ulyses Jarred M.D.   On: 05/14/2020 23:38   DG Chest Portable 1 View  Result Date: 05/14/2020 CLINICAL DATA:  Shortness of breath EXAM: PORTABLE CHEST 1 VIEW COMPARISON:  12/10/2016 FINDINGS: The heart size and mediastinal contours are within normal limits. Both lungs are clear. The visualized skeletal structures are unremarkable. IMPRESSION: No active disease. Electronically Signed   By: Donavan Foil M.D.   On: 05/14/2020 22:17    Procedures Procedures   Medications Ordered in ED Medications  vancomycin (VANCOCIN) IVPB 1000 mg/200 mL premix (has no administration in time range)  ceFEPIme (MAXIPIME) 2 g in sodium chloride 0.9 % 100 mL IVPB (has no administration in time range)  0.9 %  sodium chloride infusion ( Intravenous New Bag/Given 05/15/20 0032)  acetaminophen (TYLENOL) tablet 650 mg (650 mg Oral Given 05/14/20 2144)  iohexol (OMNIPAQUE) 350 MG/ML injection 100 mL (100 mLs Intravenous Contrast Given 05/14/20 2305)  potassium chloride SA (KLOR-CON) CR tablet 40 mEq (40 mEq Oral Given 05/15/20 0026)  ibuprofen (ADVIL) tablet 400 mg (400 mg Oral Given 05/15/20 4503)    ED Course  I have reviewed the triage vital signs and the nursing notes.  Pertinent labs & imaging results that were available during my care  of the patient were reviewed by me and considered in my medical decision making (see chart for details).    MDM Rules/Calculators/A&P                          60 year old female brought in for evaluation of shortness of breath, episode  of vomiting, lightheadedness.  She reports it started at work.  She states that she had felt kind of tired but had no other symptoms.  She reports that while at work, she started feeling short of breath, lightheaded, diaphoretic.  Had episode of vomiting.  Did have some chest pain at that time.  Reports it resolved but she still felt kind of off so she came to the emergency department.  On initial ED arrival, she was had a low-grade fever of 100.1 but was tachycardic, tachypneic and hypertensive.  On exam, she does not feel like she is having trouble breathing.  No rales noted on lung exam.  She does have a low right upper extremity that is swollen, warm to the touch, erythematous noted diffusely.  She has good pulses.  She reports of history of lymphedema to the right right upper extremity states that it is most the time swollen but states it is newly hot, red.  She denies any trauma, injury.  Benign abdominal exam.  We will plan to check labs.  Concern for possible infectious process of the right upper extremity versus DVT.  Plan for dimer.  Troponin is negative.  D-dimer is elevated at 3.44.  CBC shows leukocytosis of 12.5.  Lactic acid is normal.  CMP shows potassium 3.3.  BUN and creatinine within normal limits.  Chest x-ray negative for any acute abnormalities.    Patient spiked a fever of 102.8.  Patient given Tylenol.  Given that she is tachycardic and has an elevated D-dimer as well as complaining of some shortness of breath, we will plan for CTA of her chest for evaluation of PE.  CTA shows no PE.   Covid is negative.  At this time, I am concerned for infectious process of her arm particularly given that she spiked a fever and has a leukocytosis as was  tachycardic.  At this time, concern for cellulitis of her right upper extremity.  We will plan for IV antibiotics, admission given concerns of infection and lymphedema site.  Portions of this note were generated with [employer]. Dictation errors may occur despite best attempts at proofreading.   Final Clinical Impression(s) / ED Diagnoses Final diagnoses:  Cellulitis, unspecified cellulitis site    Rx / DC Orders ED Discharge Orders    None       Desma Mcgregor 05/15/20 0032    Palumbo, April, MD 05/15/20 [phone]

## 2020-05-14 NOTE — ED Notes (Signed)
States was at work, began to feel short of breath, felt warm, felt dizzy, states vomited x 1 in bathroom, used her Albuterol Inhaler  X 2 puffs approx 1 hour ago. States she feels better now. States she feels like she can breathe easier now. Placed on cont cardiac monitoring with cont POX and in NBP assessment.

## 2020-05-15 ENCOUNTER — Inpatient Hospital Stay (HOSPITAL_COMMUNITY): Payer: Managed Care, Other (non HMO)

## 2020-05-15 ENCOUNTER — Encounter (HOSPITAL_BASED_OUTPATIENT_CLINIC_OR_DEPARTMENT_OTHER): Payer: Self-pay | Admitting: Emergency Medicine

## 2020-05-15 DIAGNOSIS — J449 Chronic obstructive pulmonary disease, unspecified: Secondary | ICD-10-CM | POA: Diagnosis present

## 2020-05-15 DIAGNOSIS — E785 Hyperlipidemia, unspecified: Secondary | ICD-10-CM | POA: Diagnosis present

## 2020-05-15 DIAGNOSIS — R609 Edema, unspecified: Secondary | ICD-10-CM | POA: Diagnosis present

## 2020-05-15 DIAGNOSIS — J45909 Unspecified asthma, uncomplicated: Secondary | ICD-10-CM | POA: Diagnosis not present

## 2020-05-15 DIAGNOSIS — R0602 Shortness of breath: Secondary | ICD-10-CM | POA: Diagnosis not present

## 2020-05-15 DIAGNOSIS — A419 Sepsis, unspecified organism: Secondary | ICD-10-CM | POA: Diagnosis present

## 2020-05-15 DIAGNOSIS — R600 Localized edema: Secondary | ICD-10-CM

## 2020-05-15 DIAGNOSIS — Z880 Allergy status to penicillin: Secondary | ICD-10-CM | POA: Diagnosis not present

## 2020-05-15 DIAGNOSIS — Z853 Personal history of malignant neoplasm of breast: Secondary | ICD-10-CM | POA: Diagnosis not present

## 2020-05-15 DIAGNOSIS — E039 Hypothyroidism, unspecified: Secondary | ICD-10-CM | POA: Diagnosis present

## 2020-05-15 DIAGNOSIS — I10 Essential (primary) hypertension: Secondary | ICD-10-CM | POA: Diagnosis present

## 2020-05-15 DIAGNOSIS — Z79899 Other long term (current) drug therapy: Secondary | ICD-10-CM | POA: Diagnosis not present

## 2020-05-15 DIAGNOSIS — Z87891 Personal history of nicotine dependence: Secondary | ICD-10-CM | POA: Diagnosis not present

## 2020-05-15 DIAGNOSIS — I700 Atherosclerosis of aorta: Secondary | ICD-10-CM | POA: Diagnosis present

## 2020-05-15 DIAGNOSIS — L03113 Cellulitis of right upper limb: Secondary | ICD-10-CM | POA: Diagnosis present

## 2020-05-15 DIAGNOSIS — Z888 Allergy status to other drugs, medicaments and biological substances status: Secondary | ICD-10-CM | POA: Diagnosis not present

## 2020-05-15 DIAGNOSIS — Z20822 Contact with and (suspected) exposure to covid-19: Secondary | ICD-10-CM | POA: Diagnosis present

## 2020-05-15 DIAGNOSIS — Z66 Do not resuscitate: Secondary | ICD-10-CM | POA: Diagnosis present

## 2020-05-15 DIAGNOSIS — Z8249 Family history of ischemic heart disease and other diseases of the circulatory system: Secondary | ICD-10-CM | POA: Diagnosis not present

## 2020-05-15 DIAGNOSIS — I340 Nonrheumatic mitral (valve) insufficiency: Secondary | ICD-10-CM | POA: Diagnosis present

## 2020-05-15 DIAGNOSIS — Z79890 Hormone replacement therapy (postmenopausal): Secondary | ICD-10-CM | POA: Diagnosis not present

## 2020-05-15 LAB — BASIC METABOLIC PANEL
Anion gap: 10 (ref 5–15)
BUN: 12 mg/dL (ref 8–23)
CO2: 25 mmol/L (ref 22–32)
Calcium: 8.7 mg/dL — ABNORMAL LOW (ref 8.9–10.3)
Chloride: 103 mmol/L (ref 98–111)
Creatinine, Ser: 0.73 mg/dL (ref 0.44–1.00)
GFR, Estimated: >60 mL/min (ref >60)
Glucose, Bld: 102 mg/dL — ABNORMAL HIGH (ref 70–99)
Potassium: 3.4 mmol/L — ABNORMAL LOW (ref 3.5–5.1)
Sodium: 138 mmol/L (ref 135–145)

## 2020-05-15 LAB — URINALYSIS, ROUTINE W REFLEX MICROSCOPIC
Bilirubin Urine: NEGATIVE
Glucose, UA: NEGATIVE mg/dL
Hgb urine dipstick: NEGATIVE
Ketones, ur: NEGATIVE mg/dL
Leukocytes,Ua: NEGATIVE
Nitrite: NEGATIVE
Protein, ur: NEGATIVE mg/dL
Specific Gravity, Urine: <1.005 — ABNORMAL LOW (ref 1.005–1.030)
pH: 6 (ref 5.0–8.0)

## 2020-05-15 LAB — CBC
HCT: 36.6 % (ref 36.0–46.0)
Hemoglobin: 12.3 g/dL (ref 12.0–15.0)
MCH: 30.2 pg (ref 26.0–34.0)
MCHC: 33.6 g/dL (ref 30.0–36.0)
MCV: 89.9 fL (ref 80.0–100.0)
Platelets: 213 10*3/uL (ref 150–400)
RBC: 4.07 MIL/uL (ref 3.87–5.11)
RDW: 12.1 % (ref 11.5–15.5)
WBC: 13.7 10*3/uL — ABNORMAL HIGH (ref 4.0–10.5)
nRBC: 0 % (ref 0.0–0.2)

## 2020-05-15 LAB — RESP PANEL BY RT-PCR (FLU A&B, COVID) ARPGX2
Influenza A by PCR: NEGATIVE
Influenza B by PCR: NEGATIVE
SARS Coronavirus 2 by RT PCR: NEGATIVE

## 2020-05-15 LAB — LIPID PANEL
Cholesterol: 161 mg/dL (ref 0–200)
HDL: 41 mg/dL (ref >40)
LDL Cholesterol: 88 mg/dL (ref 0–99)
Total CHOL/HDL Ratio: 3.9 RATIO
Triglycerides: 158 mg/dL — ABNORMAL HIGH (ref <150)
VLDL: 32 mg/dL (ref 0–40)

## 2020-05-15 LAB — TROPONIN I (HIGH SENSITIVITY): Troponin I (High Sensitivity): 9 ng/L (ref <18)

## 2020-05-15 LAB — HIV ANTIBODY (ROUTINE TESTING W REFLEX): HIV Screen 4th Generation wRfx: NONREACTIVE

## 2020-05-15 LAB — PROCALCITONIN: Procalcitonin: 1.78 ng/mL

## 2020-05-15 LAB — LACTIC ACID, PLASMA: Lactic Acid, Venous: 1.9 mmol/L (ref 0.5–1.9)

## 2020-05-15 MED ORDER — ACETAMINOPHEN 650 MG RE SUPP
650.0000 mg | Freq: Four times a day (QID) | RECTAL | Status: DC | PRN
Start: 2020-05-15 — End: 2020-05-17

## 2020-05-15 MED ORDER — LEVOTHYROXINE SODIUM 25 MCG PO TABS: 125.0000 ug | ORAL_TABLET | Freq: Every day | ORAL | Status: DC

## 2020-05-15 MED ORDER — LACTATED RINGERS IV BOLUS (SEPSIS)
1000.0000 mL | Freq: Once | INTRAVENOUS | Status: AC
  Administered 2020-05-15: 1000 mL via INTRAVENOUS

## 2020-05-15 MED ORDER — POTASSIUM CHLORIDE CRYS ER 20 MEQ PO TBCR
40.0000 meq | EXTENDED_RELEASE_TABLET | Freq: Once | ORAL | Status: AC
  Administered 2020-05-15: 40 meq via ORAL
  Filled 2020-05-15: qty 2

## 2020-05-15 MED ORDER — ACETAMINOPHEN 325 MG PO TABS: 650.0000 mg | ORAL_TABLET | Freq: Four times a day (QID) | ORAL | Status: DC | PRN

## 2020-05-15 MED ORDER — IBUPROFEN 400 MG PO TABS
400.0000 mg | ORAL_TABLET | Freq: Once | ORAL | Status: AC
  Administered 2020-05-15: 400 mg via ORAL
  Filled 2020-05-15: qty 1

## 2020-05-15 MED ORDER — LACTATED RINGERS IV BOLUS (SEPSIS)
500.0000 mL | Freq: Once | INTRAVENOUS | Status: AC
  Administered 2020-05-15: 500 mL via INTRAVENOUS

## 2020-05-15 MED ORDER — SODIUM CHLORIDE 0.9% FLUSH
3.0000 mL | Freq: Two times a day (BID) | INTRAVENOUS | Status: DC
  Administered 2020-05-15 – 2020-05-17 (×4): 3 mL via INTRAVENOUS

## 2020-05-15 MED ORDER — SODIUM CHLORIDE 0.9 % IV SOLN
2.0000 g | Freq: Once | INTRAVENOUS | Status: AC
  Administered 2020-05-15: 2 g via INTRAVENOUS
  Filled 2020-05-15: qty 2

## 2020-05-15 MED ORDER — LABETALOL HCL 5 MG/ML IV SOLN
10.0000 mg | INTRAVENOUS | Status: DC | PRN
  Filled 2020-05-15: qty 4

## 2020-05-15 MED ORDER — SODIUM CHLORIDE 0.9 % IV SOLN: INTRAVENOUS | Status: DC | PRN

## 2020-05-15 MED ORDER — VANCOMYCIN HCL IN DEXTROSE 1-5 GM/200ML-% IV SOLN
1000.0000 mg | Freq: Once | INTRAVENOUS | Status: AC
  Administered 2020-05-15: 1000 mg via INTRAVENOUS
  Filled 2020-05-15: qty 200

## 2020-05-15 MED ORDER — ENOXAPARIN SODIUM 40 MG/0.4ML ~~LOC~~ SOLN
40.0000 mg | Freq: Every day | SUBCUTANEOUS | Status: DC
  Administered 2020-05-15 – 2020-05-16 (×2): 40 mg via SUBCUTANEOUS
  Filled 2020-05-15 (×3): qty 0.4

## 2020-05-15 MED ORDER — LEVOTHYROXINE SODIUM 50 MCG PO TABS
175.0000 ug | ORAL_TABLET | Freq: Every day | ORAL | Status: DC
  Administered 2020-05-15 – 2020-05-17 (×3): 175 ug via ORAL
  Filled 2020-05-15 (×4): qty 1

## 2020-05-15 MED ORDER — ALBUTEROL SULFATE (2.5 MG/3ML) 0.083% IN NEBU: 2.5000 mg | INHALATION_SOLUTION | Freq: Four times a day (QID) | RESPIRATORY_TRACT | Status: DC | PRN

## 2020-05-15 MED ORDER — SODIUM CHLORIDE 0.9 % IV SOLN
2.0000 g | INTRAVENOUS | Status: DC
  Administered 2020-05-15 – 2020-05-17 (×3): 2 g via INTRAVENOUS
  Filled 2020-05-15 (×3): qty 2

## 2020-05-15 MED ORDER — HYDROCODONE-ACETAMINOPHEN 5-325 MG PO TABS
1.0000 | ORAL_TABLET | ORAL | Status: DC | PRN
  Administered 2020-05-15 – 2020-05-17 (×4): 1 via ORAL
  Filled 2020-05-15 (×4): qty 1

## 2020-05-15 NOTE — Progress Notes (Signed)
Patients IV access is leaking. IV bolus stopped. IV team consult ordered. They will be up shortly once ultrasound machine is charged to start an IV. Once IV is placed rocephin will be given and IV Bolus will be restarted.

## 2020-05-15 NOTE — H&P (Signed)
History and Physical        Hospital Admission Note Date: 05/15/2020  Patient name: Donna Pace Medical record number: [id number] Date of birth: [birth year] Age: 60 y.o. Gender: female  PCP: System, Provider Not In    Chief Complaint    Chief Complaint  Patient presents with  . Shortness of Breath      HPI:   This is a 60 year old female with past medical history of hypertension, hyperlipidemia, distant tobacco use, mitral valve regurgitation, hypothyroidism, asthma and COPD, right breast cancer s/p lumpectomy with RUE lymphedema who presented to [facility] for evaluation of shortness of breath while at work yesterday afternoon.  Patient felt lightheaded, warm and dizzy followed by shortness of breath with chest pain.  Patient had an episode of vomiting and diaphoresis. No history of diabetes, cardiac history or family cardiac history prior to the age of 1. Used her inhaler x2 with some improvement in shortness of breath. Says that she has been using her inhaler 3-4 times daily this week as opposed to 1-2 times daily on a regular basis.  Associated with some fatigue for the past 2 days without any fever, cough or congestion.  Additionally has had swelling in her right arm which does occasionally get swollen though more swollen, warm and erythematous which is new.  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 Says her breast cancer is in remission. No history of blood clots, no recent travel or sick contacts. Feels better currently.   ED Course: Febrile, tachycardic, tachypneic,  hypertensive, on room air. Notable Labs: Sodium 130, K3.3, BUN 13, creatinine 1.02, troponin 7-> 9, lactic acid 1.7, WBC 12.5, Hb 14.4, D-dimer 3.44, COVID-19 and flu negative. Notable Imaging: CTA chest negative for PE. Patient received Tylenol, ibuprofen,  KCl, vancomycin, cefepime.    Vitals:   05/15/20 0700 05/15/20 0835  BP: (!) 108/59 129/84  Pulse: 80 86  Resp: (!) 22 18  Temp:  98.7 F (37.1 C)  SpO2: 97% 100%     Review of Systems:  Review of Systems  All other systems reviewed and are negative.   Medical/Social/Family History   Past Medical History: Past Medical History:  Diagnosis Date  . Asthma   . Cancer [facility]) 2004   breast cancer  . Hyperlipidemia   . Hypertension   . Thyroid disease    hypothyroidism  . Unspecified vitamin D deficiency 04/09/2012    Past Surgical History:  Procedure Laterality Date  . BREAST LUMPECTOMY  2004   right breast    Medications: Prior to Admission medications   Medication Sig Start Date End Date Taking? Authorizing Provider  albuterol (VENTOLIN HFA) 108 (90 BASE) MCG/ACT inhaler Inhale 2 puffs into the lungs every 6 (six) hours as needed. 07/23/12   Debbrah Alar, NP  Calcium Carbonate-Vitamin D (CALTRATE 600+D) 600-400 MG-UNIT per tablet Take 1 tablet by mouth 2 (two) times daily. 03/15/12   Debbrah Alar, NP  levothyroxine (SYNTHROID, LEVOTHROID) 125 MCG tablet Take 1 tablet (125 mcg total) by mouth daily. 06/05/12   Debbrah Alar, NP  lisinopril-hydrochlorothiazide (PRINZIDE,ZESTORETIC) 20-25 MG per tablet Take 1 tablet by mouth daily. 02/14/12  Debbrah Alar, NP    Allergies:   Allergies  Allergen Reactions  . Ace Inhibitors Itching and Other (See Comments)    Cough Cough Cough Cough   . Penicillins Rash    Social History:  reports that she has quit smoking. Her smoking use included cigarettes. She has never used smokeless tobacco. She reports that she does not drink alcohol. No history on file for drug use.  Family History: Family History  Problem Relation Age of Onset  . Diabetes Mother   . Hypertension Mother   . Diabetes Father   . Cancer Sister        bone cancer     Objective   Physical Exam: Blood pressure 129/84, pulse 86,  temperature 98.7 F (37.1 C), temperature source Oral, resp. rate 18, height 5\' 6"  (1.676 m), weight 79.4 kg, last menstrual period 02/06/2006, SpO2 100 %.  Physical Exam Vitals and nursing note reviewed.  Constitutional:      Appearance: Normal appearance.  HENT:     Head: Normocephalic and atraumatic.  Eyes:     Conjunctiva/sclera: Conjunctivae normal.  Cardiovascular:     Rate and Rhythm: Normal rate and regular rhythm.     Pulses: Normal pulses.  Pulmonary:     Effort: Pulmonary effort is normal.     Breath sounds: Normal breath sounds. No wheezing or rales.  Abdominal:     General: Abdomen is flat.     Palpations: Abdomen is soft.  Musculoskeletal:        General: No swelling or tenderness.     Right upper arm: Swelling and edema present.     Comments: Warmth of RUE Not particularly tender  Skin:    Coloration: Skin is not jaundiced or pale.  Neurological:     Mental Status: She is alert. Mental status is at baseline.  Psychiatric:        Mood and Affect: Mood normal.        Behavior: Behavior normal.     LABS on Admission: I have personally reviewed all the labs and imaging below    Basic Metabolic Panel: Recent Labs  Lab 05/14/20 2151  NA 138  K 3.3*  CL 100  CO2 26  GLUCOSE 106*  BUN 13  CREATININE 1.02*  CALCIUM 9.4   Liver Function Tests: Recent Labs  Lab 05/14/20 2151  AST 37  ALT 52*  ALKPHOS 70  BILITOT 0.4  PROT 7.4  ALBUMIN 3.9   No results for input(s): LIPASE, AMYLASE in the last 168 hours. No results for input(s): AMMONIA in the last 168 hours. CBC: Recent Labs  Lab 05/14/20 2151  WBC 12.5*  NEUTROABS 10.2*  HGB 14.4  HCT 42.6  MCV 88.6  PLT 230   Cardiac Enzymes: No results for input(s): CKTOTAL, CKMB, CKMBINDEX, TROPONINI in the last 168 hours. BNP: Invalid input(s): POCBNP CBG: No results for input(s): GLUCAP in the last 168 hours.  Radiological Exams on Admission:  CT Angio Chest PE W and/or Wo Contrast  Result  Date: 05/14/2020 CLINICAL DATA:  Positive D-dimer.  Shortness of breath. EXAM: CT ANGIOGRAPHY CHEST WITH CONTRAST TECHNIQUE: Multidetector CT imaging of the chest was performed using the standard protocol during bolus administration of intravenous contrast. Multiplanar CT image reconstructions and MIPs were obtained to evaluate the vascular anatomy. CONTRAST:  191mL OMNIPAQUE IOHEXOL 350 MG/ML SOLN COMPARISON:  None. FINDINGS: Cardiovascular: Contrast injection is sufficient to demonstrate satisfactory opacification of the pulmonary arteries to the segmental level. There is no  pulmonary embolus or evidence of right heart strain. The size of the main pulmonary artery is normal. Heart size is normal, with no pericardial effusion. The course and caliber of the aorta are normal. There is mild atherosclerotic calcification. Opacification decreased due to pulmonary arterial phase contrast bolus timing. Mediastinum/Nodes: No mediastinal, hilar or axillary lymphadenopathy. Normal visualized thyroid. Thoracic esophageal course is normal. Lungs/Pleura: Airways are patent. No pleural effusion, lobar consolidation, pneumothorax or pulmonary infarction. Upper Abdomen: Contrast bolus timing is not optimized for evaluation of the abdominal organs. The visualized portions of the organs of the upper abdomen are normal. Musculoskeletal: No chest wall abnormality. No bony spinal canal stenosis. Review of the MIP images confirms the above findings. IMPRESSION: 1. No pulmonary embolus or other acute thoracic abnormality. Aortic Atherosclerosis (ICD10-I70.0). Electronically Signed   By: Ulyses Jarred M.D.   On: 05/14/2020 23:38   DG Chest Portable 1 View  Result Date: 05/14/2020 CLINICAL DATA:  Shortness of breath EXAM: PORTABLE CHEST 1 VIEW COMPARISON:  12/10/2016 FINDINGS: The heart size and mediastinal contours are within normal limits. Both lungs are clear. The visualized skeletal structures are unremarkable. IMPRESSION: No active  disease. Electronically Signed   By: Donavan Foil M.D.   On: 05/14/2020 22:17      EKG: Sinus tachycardia, poor R wave progression, Q waves in anterior leads   A & P   Principal Problem:   Sepsis ([place]) Active Problems:   Hypothyroid   HTN (hypertension)   Asthma   HX: breast cancer   Hyperlipidemia, unspecified   Aortic atherosclerosis (H[facility])   Edema of right upper arm   1. Sepsis without septic shock suspect secondary to either RUE nonpurulent cellulitis vs. RUE DVT a. Sepsis criteria: fever, tachycardia, tachypnea, leukocytosis b. Currently does not appear toxic c. D dimer elevated d. IV fluids per sepsis protocol but monitor RUE swelling closely e. Elevate RUE above heart level f. Follow up blood cultures g. Hold vancomycin/cefepime and start ceftriaxone pending RUE [place] results h. If RUE [place] positive then probably can discontinue antibiotics and start anticoagulants  2. Shortness of breath, nausea, vomiting of unclear etiology a. Possibly mild asthma vs. COPD exacerbation b. EKG and troponin x2 reassuring  c. CTA chest negative for PE d. Currently tolerating room air and without any wheeze or respiratory distress e. Continue PRN albuterol  3. Hypothyroid a. Continue home levothyroxine  4. Hypertension a. Hold home lisinopril/hctz b. PRN labetalol   5. Hyperlipidemia  Aortic atherosclerosis a. Not on statin b. Q waves on EKG c. Check Lipid panel  6. Hx of right sided breast cancer s/p lumpectomy with recurrent RUE lymphedema a. Concern for DVT vs. Cellulitis of RUE as above    DVT prophylaxis: lovenox   Code Status: DNR  Diet: heart heatlhy Family Communication: Admission, patients condition and plan of care including tests being ordered have been discussed with the patient who indicates understanding and agrees with the plan and Code Status.   Disposition Plan: The appropriate patient status for this patient is INPATIENT. Inpatient status is judged to be  reasonable and necessary in order to provide the required intensity of service to ensure the patient's safety. The patient's presenting symptoms, physical exam findings, and initial radiographic and laboratory data in the context of their chronic comorbidities is felt to place them at high risk for further clinical deterioration. Furthermore, it is not anticipated that the patient will be medically stable for discharge from the hospital within 2 midnights of admission. The  following factors support the patient status of inpatient.   " The patient's presenting symptoms include shortness of breath, RUE swelling. " The worrisome physical exam findings include RUE swelling, warmth. " The initial radiographic and laboratory data are worrisome because of elevated D dimer, leukocytosis. " The chronic co-morbidities include hx breast cancer, HTN.   * I certify that at the point of admission it is my clinical judgment that the patient will require inpatient hospital care spanning beyond 2 midnights from the point of admission due to high intensity of service, high risk for further deterioration and high frequency of surveillance required.*   Status is: Inpatient  Remains inpatient appropriate because:IV treatments appropriate due to intensity of illness or inability to take PO and Inpatient level of care appropriate due to severity of illness   Dispo: The patient is from: Home              Anticipated d/c is to: Home              Patient currently is not medically stable to d/c.   Difficult to place patient No      Consultants  . none  Procedures  . none  Time Spent on Admission: 67 minutes    Harold Hedge, DO [facility]  05/15/2020, 10:09 AM

## 2020-05-15 NOTE — CV Procedure (Signed)
RUE venous duplex completed.  Results can be found under chart review under CV PROC. 05/15/2020 12:49 PM Viriginia Amendola RVT, RDMS

## 2020-05-16 DIAGNOSIS — A419 Sepsis, unspecified organism: Principal | ICD-10-CM

## 2020-05-16 NOTE — Progress Notes (Signed)
PROGRESS NOTE  Donna Pace  DOB: 11-Mar-1960  PCP: System, Provider Not In [facility]:[id number]  DOA: 05/14/2020  LOS: 1 day   Chief Complaint  Patient presents with  . Shortness of Breath   Brief narrative: Donna Pace is a 61 y.o. female with PMH significant for hypertension, hyperlipidemia, distant tobacco use, mitral valve regurgitation, hypothyroidism, asthma and COPD, right breast cancer s/p lumpectomy with RUE lymphedema.   Patient presented to the ED on 4/8 for evaluation of shortness of breath while at work associated with chest pain, lightheadedness, warmth and dizzy feeling.  Says that she has been using her inhaler 3-4 times daily this week as opposed to 1-2 times daily on a regular basis.  Associated with some fatigue for the past 2 days without any fever, cough or congestion.  Additionally has had swelling in her right arm which does occasionally get swollen though more swollen, warm and erythematous which is new.  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 Says her breast cancer is in remission. No history of blood clots, no recent travel or sick contacts.  In the ED, patient was initially afebrile but per ER documentation, she spiked a fever of 102.8 later. heart rate more than 100, blood pressure in low 100s, breathing on room air Lab with WC count elevated to 12.5, potassium low at 3.3, lactic acid normal at 1.7, D-dimer elevated to 3.44. CTA chest negative for pulmonary embolism Upper extremity venous duplex negative for DVT Admitted to hospital service for further evaluation management  Subjective: Patient was seen and examined this morning. Pleasant middle-aged African-American female.  Lying on bed.  Not in distress.  Right upper extremity pain improving. Chart reviewed.  No fever, hemodynamically stable. Labs this morning with WBC count is still elevated at 13.7, procalcitonin level elevated to 1.78,  potassium low at 3.4.  Assessment/Plan: Sepsis - POA likely due to nonpurulent right upper extremity cellulitis -Presented with fever, tachycardia, tachypnea, leukocytosis, redness, warmth and worsening swelling of right upper extremity. -Patient has a history of right-sided breast cancer status post lumpectomy and lymph node dissection after which she has chronic right upper extremity lymphedema. -No evidence of DVT on ultrasound -On IV Rocephin for cellulitis.  Gradually improving -It seems that the blood cultures were not sent from ED.  Low yield of sending them now. Recent Labs  Lab 05/14/20 2151 05/15/20 1244  WBC 12.5* 13.7*  LATICACIDVEN 1.7  --   PROCALCITON  --  1.78   Dyspnea -Patient presenting symptoms of dyspnea, chest pain, lightheadedness while at work. -CT angio of chest did not show any intrathoracic pathology. -Asthma attack versus anxiety -Currently not in respiratory distress. -Continue as needed albuterol  Hypothyroid Continue home levothyroxine  Hypertension -Home meds include HCTZ 25 mg daily, currently on hold  Hyperlipidemia  Aortic atherosclerosis -Not on a statin.  HDL and LDL controlled  Hx of right sided breast cancer s/p lumpectomy with recurrent RUE lymphedema -In remission  Mobility: Encourage ambulation.  Independent Code Status:   Code Status: Partial Code  Nutritional status: Body mass index is 28.25 kg/m.     Diet Order            Diet Heart Room service appropriate? Yes; Fluid consistency: Thin  Diet effective now                 DVT prophylaxis: enoxaparin (LOVENOX) injection 40 mg Start: 05/15/20 1015  Antimicrobials:  IV Rocephin Fluid: Oral hydration encouraged Consultants: None Family Communication:  None at bedside  Status is: Inpatient  Remains inpatient appropriate because: Needs IV antibiotics  Dispo: The patient is from: Home              Anticipated d/c is to: Home              Patient currently is  not medically stable to d/c.   Difficult to place patient No       Infusions:  . sodium chloride 10 mL/hr at 05/15/20 0032  . cefTRIAXone (ROCEPHIN)  IV 2 g (05/16/20 0939)    Scheduled Meds: . enoxaparin (LOVENOX) injection  40 mg Subcutaneous Daily  . levothyroxine  175 mcg Oral Q0600  . sodium chloride flush  3 mL Intravenous Q12H    Antimicrobials: Anti-infectives (From admission, onward)   Start     Dose/Rate Route Frequency Ordered Stop   05/15/20 1015  cefTRIAXone (ROCEPHIN) 2 g in sodium chloride 0.9 % 100 mL IVPB        2 g 200 mL/hr over 30 Minutes Intravenous Every 24 hours 05/15/20 0951     05/15/20 0030  vancomycin (VANCOCIN) IVPB 1000 mg/200 mL premix        1,000 mg 200 mL/hr over 60 Minutes Intravenous  Once 05/15/20 0025 05/15/20 0244   05/15/20 0030  ceFEPIme (MAXIPIME) 2 g in sodium chloride 0.9 % 100 mL IVPB        2 g 200 mL/hr over 30 Minutes Intravenous  Once 05/15/20 0025 05/15/20 0112      PRN meds: sodium chloride, acetaminophen **OR** acetaminophen, albuterol, HYDROcodone-acetaminophen, labetalol   Objective: Vitals:   05/15/20 2032 05/16/20 0512  BP: 138/68 134/69  Pulse: 94 81  Resp: 18 18  Temp: 98.2 F (36.8 C) 98 F (36.7 C)  SpO2: 98% 92%    Intake/Output Summary (Last 24 hours) at 05/16/2020 1249 Last data filed at 05/16/2020 0900 Gross per 24 hour  Intake 819.1 ml  Output --  Net 819.1 ml   Filed Weights   05/14/20 2120  Weight: 79.4 kg   Weight change:  Body mass index is 28.25 kg/m.   Physical Exam: General exam: Middle-aged African-American female.  Not in physical distress Skin: No rashes, lesions or ulcers. HEENT: Atraumatic, normocephalic, no obvious bleeding Lungs: Clear to auscultation bilaterally CVS: Regular rate and rhythm, no murmur GI/Abd soft, nontender, nondistended, bowel sound present CNS: Alert, awake, oriented x3 Psychiatry: Mood appropriate Extremities: No pedal edema, no calf tenderness.   Chronic lymphedema of right upper extremity.  Feels warm and tender.  Unable to find redness because of the skin pigmentation.  Data Review: I have personally reviewed the laboratory data and studies available.  Recent Labs  Lab 05/14/20 2151 05/15/20 1244  WBC 12.5* 13.7*  NEUTROABS 10.2*  --   HGB 14.4 12.3  HCT 42.6 36.6  MCV 88.6 89.9  PLT 230 213   Recent Labs  Lab 05/14/20 2151 05/15/20 1244  NA 138 138  K 3.3* 3.4*  CL 100 103  CO2 26 25  GLUCOSE 106* 102*  BUN 13 12  CREATININE 1.02* 0.73  CALCIUM 9.4 8.7*    F/u labs ordered Unresulted Labs (From admission, onward)          Start     Ordered   05/17/20 0500  CBC with Differential/Platelet  Daily,   R     Question:  Specimen collection method  Answer:  Lab=Lab collect   05/16/20 1249   05/17/20 8251  Basic metabolic panel  Daily,   R     Question:  Specimen collection method  Answer:  Lab=Lab collect   05/16/20 1249          Signed, Terrilee Croak, MD [facility] 05/16/2020

## 2020-05-17 LAB — BASIC METABOLIC PANEL
Anion gap: 10 (ref 5–15)
BUN: 11 mg/dL (ref 8–23)
CO2: 22 mmol/L (ref 22–32)
Calcium: 8.6 mg/dL — ABNORMAL LOW (ref 8.9–10.3)
Chloride: 106 mmol/L (ref 98–111)
Creatinine, Ser: 0.66 mg/dL (ref 0.44–1.00)
GFR, Estimated: >60 mL/min (ref >60)
Glucose, Bld: 101 mg/dL — ABNORMAL HIGH (ref 70–99)
Potassium: 3.6 mmol/L (ref 3.5–5.1)
Sodium: 138 mmol/L (ref 135–145)

## 2020-05-17 LAB — CBC WITH DIFFERENTIAL/PLATELET
Abs Immature Granulocytes: 0.04 10*3/uL (ref 0.00–0.07)
Basophils Absolute: 0 10*3/uL (ref 0.0–0.1)
Basophils Relative: 0 %
Eosinophils Absolute: 0.3 10*3/uL (ref 0.0–0.5)
Eosinophils Relative: 4 %
HCT: 34.6 % — ABNORMAL LOW (ref 36.0–46.0)
Hemoglobin: 11.3 g/dL — ABNORMAL LOW (ref 12.0–15.0)
Immature Granulocytes: 1 %
Lymphocytes Relative: 38 %
Lymphs Abs: 2.8 10*3/uL (ref 0.7–4.0)
MCH: 29.4 pg (ref 26.0–34.0)
MCHC: 32.7 g/dL (ref 30.0–36.0)
MCV: 90.1 fL (ref 80.0–100.0)
Monocytes Absolute: 0.4 10*3/uL (ref 0.1–1.0)
Monocytes Relative: 5 %
Neutro Abs: 3.8 10*3/uL (ref 1.7–7.7)
Neutrophils Relative %: 52 %
Platelets: 206 10*3/uL (ref 150–400)
RBC: 3.84 MIL/uL — ABNORMAL LOW (ref 3.87–5.11)
RDW: 12 % (ref 11.5–15.5)
WBC: 7.2 10*3/uL (ref 4.0–10.5)
nRBC: 0 % (ref 0.0–0.2)

## 2020-05-17 MED ORDER — CEFDINIR 300 MG PO CAPS: 300.0000 mg | ORAL_CAPSULE | Freq: Two times a day (BID) | ORAL | 0 refills | Status: AC

## 2020-05-17 MED ORDER — SACCHAROMYCES BOULARDII 250 MG PO CAPS: 250.0000 mg | ORAL_CAPSULE | Freq: Two times a day (BID) | ORAL | 0 refills | Status: AC

## 2020-05-17 MED ORDER — HYDROCODONE-ACETAMINOPHEN 5-325 MG PO TABS: 1.0000 | ORAL_TABLET | Freq: Four times a day (QID) | ORAL | 0 refills | Status: AC | PRN

## 2020-05-17 NOTE — Discharge Summary (Signed)
Physician Discharge Summary  Donna Pace QIW:[id number] DOB: 03-04-1960 DOA: 05/14/2020  PCP: System, Provider Not In  Admit date: 05/14/2020 Discharge date: 05/17/2020  Admitted From: home Discharge disposition: home   Code Status: Partial Code  Diet Recommendation: cardiac diet  Discharge Diagnosis:   Principal Problem:   Sepsis ([place]) Active Problems:   Hypothyroid   HTN (hypertension)   Asthma   HX: breast cancer   Hyperlipidemia, unspecified   Aortic atherosclerosis ([place])   Edema of right upper arm  Chief Complaint  Patient presents with  . Shortness of Breath   Brief narrative: Donna Pace is a 60 y.o. female with PMH significant for hypertension, hyperlipidemia, distant tobacco use, mitral valve regurgitation, hypothyroidism, asthma and COPD, right breast cancer s/p lumpectomy with RUE lymphedema.   Patient presented to the ED on 4/8 for evaluation of shortness of breath while at work associated with chest pain, lightheadedness, warmth and dizzy feeling.  Says that she has been using her inhaler 3-4 times daily this week as opposed to 1-2 times daily on a regular basis.  Associated with some fatigue for the past 2 days without any fever, cough or congestion.  Additionally has had swelling in her right arm which does occasionally get swollen though more swollen, warm and erythematous which is new.  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 Says her breast cancer is in remission. No history of blood clots, no recent travel or sick contacts.  In the ED, patient was initially afebrile but per ER documentation, she spiked a fever of 102.8 later. heart rate more than 100, blood pressure in low 100s, breathing on room air Lab with WC count elevated to 12.5, potassium low at 3.3, lactic acid normal at 1.7, D-dimer elevated to 3.44. CTA chest negative for pulmonary embolism Upper extremity venous duplex negative  for DVT. Admitted to hospital service for further evaluation management.  Subjective: Patient was seen and examined this morning. Pleasant middle-aged African-American female.  Sitting up in chair.  Taking her breakfast.  Not in distress.  Right arm swelling, pain improving.  Hospital course: Sepsis - POA likely due to nonpurulent right upper extremity cellulitis -Presented with fever, tachycardia, tachypnea, leukocytosis, redness, warmth and worsening swelling of right upper extremity. -Patient has a history of right-sided breast cancer status post lumpectomy and lymph node dissection after which she has chronic right upper extremity lymphedema. -No evidence of DVT on ultrasound -Clinically improving on IV Rocephin for cellulitis.  Discharge home on 3 more days of oral Omnicef with probiotics. -Advised for compression pending of right upper extremity Recent Labs  Lab 05/14/20 2151 05/15/20 0854 05/15/20 1244 05/17/20 0630  WBC 12.5*  --  13.7* 7.2  LATICACIDVEN 1.7 1.9  --   --   PROCALCITON  --   --  1.78  --    Dyspnea -Patient presented with symptoms of dyspnea, chest pain, lightheadedness while at work. -CT angio of chest did not show any intrathoracic pathology. -Asthma attack versus anxiety -Currently not in respiratory distress -Continue as needed albuterol  Hypothyroid Continue home levothyroxine  Hypertension -Home meds include HCTZ 25 mg daily.  Okay to resume post discharge.  Hyperlipidemia  Aortic atherosclerosis -Not on a statin.  HDL and LDL controlled  Hx of right sided breast cancer s/p lumpectomy with recurrent RUE lymphedema -In remission  Stable to discharge home today.   Wound care:    Discharge  Exam:   Vitals:   05/16/20 1403 05/16/20 2333 05/17/20 0428 05/17/20 1341  BP: 138/73 (!) 152/73 132/70 (!) 169/77  Pulse: 83 77 73 70  Resp: 18 18 16 17   Temp: 98.3 F (36.8 C) 98.4 F (36.9 C) 97.6 F (36.4 C) (!) 97.3 F (36.3 C)   TempSrc: Oral Oral Oral Oral  SpO2: 95% 97% 100% 100%  Weight:      Height:        Body mass index is 28.25 kg/m.  General exam: Pleasant, middle-aged African-American female.  Not in distress Skin: No rashes, lesions or ulcers. HEENT: Atraumatic, normocephalic, no obvious bleeding Lungs: Clear to auscultation bilaterally CVS: Regular rate and rhythm, no murmur GI/Abd soft, nontender, nondistended, bowel sound present CNS: Alert, awake, oriented x3 Psychiatry: Mood appropriate Extremities: No pedal edema, no calf tenderness.  Right upper extremity is chronically swollen because of lymphedema.  Gradually improving swelling and pain.  Follow ups:   Discharge Instructions    Diet - low sodium heart healthy   Complete by: As directed    Increase activity slowly   Complete by: As directed       Follow-up Information    [facility].   Contact information: [address] [place] [address] [phone]              Recommendations for Outpatient Follow-Up:   1. Follow-up with PCP as an outpatient 2. Follow-up with oncology as an outpatient  Discharge Instructions:  Follow with Primary MD System, Provider Not In in 7 days   Get CBC/BMP checked in next visit within 1 week by PCP or SNF MD ( we routinely change or add medications that can affect your baseline labs and fluid status, therefore we recommend that you get the mentioned basic workup next visit with your PCP, your PCP may decide not to get them or add new tests based on their clinical decision)  On your next visit with your PCP, please Get Medicines reviewed and adjusted.  Please request your PCP  to go over all Hospital Tests and Procedure/Radiological results at the follow up, please get all Hospital records sent to your Prim MD by signing hospital release before you go home.  Activity: As tolerated with Full fall precautions use walker/cane &  assistance as needed  For Heart failure patients - Check your Weight same time everyday, if you gain over 2 pounds, or you develop in leg swelling, experience more shortness of breath or chest pain, call your Primary MD immediately. Follow Cardiac Low Salt Diet and 1.5 lit/day fluid restriction.  If you have smoked or chewed Tobacco in the last 2 yrs please stop smoking, stop any regular Alcohol  and or any Recreational drug use.  If you experience worsening of your admission symptoms, develop shortness of breath, life threatening emergency, suicidal or homicidal thoughts you must seek medical attention immediately by calling 911 or calling your MD immediately  if symptoms less severe.  You Must read complete instructions/literature along with all the possible adverse reactions/side effects for all the Medicines you take and that have been prescribed to you. Take any new Medicines after you have completely understood and accpet all the possible adverse reactions/side effects.   Do not drive, operate heavy machinery, perform activities at heights, swimming or participation in water activities or provide baby sitting services if your were admitted for syncope or siezures until you have seen by Primary MD or  a Neurologist and advised to do so again.  Do not drive when taking Pain medications.  Do not take more than prescribed Pain, Sleep and Anxiety Medications  Wear Seat belts while driving.   Please note You were cared for by a hospitalist during your hospital stay. If you have any questions about your discharge medications or the care you received while you were in the hospital after you are discharged, you can call the unit and asked to speak with the hospitalist on call if the hospitalist that took care of you is not available. Once you are discharged, your primary care physician will handle any further medical issues. Please note that NO REFILLS for any discharge medications will be authorized  once you are discharged, as it is imperative that you return to your primary care physician (or establish a relationship with a primary care physician if you do not have one) for your aftercare needs so that they can reassess your need for medications and monitor your lab values.    Allergies as of 05/17/2020      Reactions   Ace Inhibitors Itching, Other (See Comments)   Cough Cough Cough Cough   Penicillins Rash      Medication List    STOP taking these medications   acetaminophen 500 MG tablet Commonly known as: TYLENOL   lisinopril-hydrochlorothiazide 20-25 MG tablet Commonly known as: ZESTORETIC     TAKE these medications   albuterol 108 (90 Base) MCG/ACT inhaler Commonly known as: Ventolin HFA Inhale 2 puffs into the lungs every 6 (six) hours as needed.   Calcium Carbonate-Vitamin D 600-400 MG-UNIT tablet Commonly known as: Caltrate 600+D Take 1 tablet by mouth 2 (two) times daily.   cefdinir 300 MG capsule Commonly known as: OMNICEF Take 1 capsule (300 mg total) by mouth 2 (two) times daily for 3 days.   hydrochlorothiazide 25 MG tablet Commonly known as: HYDRODIURIL Take 25 mg by mouth daily.   HYDROcodone-acetaminophen 5-325 MG tablet Commonly known as: NORCO/VICODIN Take 1 tablet by mouth every 6 (six) hours as needed for up to 3 days for moderate pain.   levothyroxine 175 MCG tablet Commonly known as: SYNTHROID Take 175 mcg by mouth daily before breakfast. What changed: Another medication with the same name was removed. Continue taking this medication, and follow the directions you see here.   montelukast 10 MG tablet Commonly known as: SINGULAIR Take 10 mg by mouth daily.   predniSONE 20 MG tablet Commonly known as: DELTASONE Take 20 mg by mouth See admin instructions. Taper, take 2 tablets for 2 days, then 1.5 tablets for 2 days, then one tablet for 2 days   saccharomyces boulardii 250 MG capsule Commonly known as: FLORASTOR Take 1 capsule (250  mg total) by mouth 2 (two) times daily for 5 days.   Trelegy Ellipta 200-62.5-25 MCG/INH Aepb Generic drug: Fluticasone-Umeclidin-Vilant Inhale 1 puff into the lungs daily.   Vitamin D (Ergocalciferol) 1.25 MG (50000 UNIT) Caps capsule Commonly known as: DRISDOL Take 1 capsule by mouth once a week.       Time coordinating discharge: 35 minutes  The results of significant diagnostics from this hospitalization (including imaging, microbiology, ancillary and laboratory) are listed below for reference.    Procedures and Diagnostic Studies:   CT Angio Chest PE W and/or Wo Contrast  Result Date: 05/14/2020 CLINICAL DATA:  Positive D-dimer.  Shortness of breath. EXAM: CT ANGIOGRAPHY CHEST WITH CONTRAST TECHNIQUE: Multidetector CT imaging of the chest was performed using the  standard protocol during bolus administration of intravenous contrast. Multiplanar CT image reconstructions and MIPs were obtained to evaluate the vascular anatomy. CONTRAST:  133mL OMNIPAQUE IOHEXOL 350 MG/ML SOLN COMPARISON:  None. FINDINGS: Cardiovascular: Contrast injection is sufficient to demonstrate satisfactory opacification of the pulmonary arteries to the segmental level. There is no pulmonary embolus or evidence of right heart strain. The size of the main pulmonary artery is normal. Heart size is normal, with no pericardial effusion. The course and caliber of the aorta are normal. There is mild atherosclerotic calcification. Opacification decreased due to pulmonary arterial phase contrast bolus timing. Mediastinum/Nodes: No mediastinal, hilar or axillary lymphadenopathy. Normal visualized thyroid. Thoracic esophageal course is normal. Lungs/Pleura: Airways are patent. No pleural effusion, lobar consolidation, pneumothorax or pulmonary infarction. Upper Abdomen: Contrast bolus timing is not optimized for evaluation of the abdominal organs. The visualized portions of the organs of the upper abdomen are normal.  Musculoskeletal: No chest wall abnormality. No bony spinal canal stenosis. Review of the MIP images confirms the above findings. IMPRESSION: 1. No pulmonary embolus or other acute thoracic abnormality. Aortic Atherosclerosis (ICD10-I70.0). Electronically Signed   By: Ulyses Jarred M.D.   On: 05/14/2020 23:38   DG Chest Portable 1 View  Result Date: 05/14/2020 CLINICAL DATA:  Shortness of breath EXAM: PORTABLE CHEST 1 VIEW COMPARISON:  12/10/2016 FINDINGS: The heart size and mediastinal contours are within normal limits. Both lungs are clear. The visualized skeletal structures are unremarkable. IMPRESSION: No active disease. Electronically Signed   By: Donavan Foil M.D.   On: 05/14/2020 22:17   VAS [place] UPPER EXTREMITY VENOUS DUPLEX  Result Date: 05/15/2020 UPPER VENOUS STUDY  Indications: Edema, and SOB Risk Factors: Cancer HX of right breast CA with lumpectomy and RUE lymphedema. Astma, COPD, MVR. Limitations: Edema. Comparison Study: Previous exam 12/30.09 - negative Performing Technologist: Rogelia Rohrer  Examination Guidelines: A complete evaluation includes B-mode imaging, spectral Doppler, color Doppler, and power Doppler as needed of all accessible portions of each vessel. Bilateral testing is considered an integral part of a complete examination. Limited examinations for reoccurring indications may be performed as noted.  Right Findings: +----------+------------+---------+-----------+----------+-------------------+ RIGHT     CompressiblePhasicitySpontaneousProperties      Summary       +----------+------------+---------+-----------+----------+-------------------+ IJV           Full       Yes       Yes                                  +----------+------------+---------+-----------+----------+-------------------+ Subclavian    Full       Yes       Yes                                  +----------+------------+---------+-----------+----------+-------------------+ Axillary      Full        Yes       Yes                                  +----------+------------+---------+-----------+----------+-------------------+ Brachial      Full       Yes       Yes                                  +----------+------------+---------+-----------+----------+-------------------+  Radial        Full                                                      +----------+------------+---------+-----------+----------+-------------------+ Ulnar         Full                                  Not well visualized +----------+------------+---------+-----------+----------+-------------------+ Cephalic      Full                                  Not well visualized +----------+------------+---------+-----------+----------+-------------------+ Basilic       Full       Yes       Yes                                  +----------+------------+---------+-----------+----------+-------------------+ Subcutaneous edema throughout RUE  Left Findings: +----------+------------+---------+-----------+----------+-------+ LEFT      CompressiblePhasicitySpontaneousPropertiesSummary +----------+------------+---------+-----------+----------+-------+ Subclavian    Full       Yes       Yes                      +----------+------------+---------+-----------+----------+-------+  Summary:  Right: No evidence of deep vein thrombosis in the upper extremity. No evidence of superficial vein thrombosis in the upper extremity.  Left: No evidence of thrombosis in the subclavian.  *See table(s) above for measurements and observations.  Diagnosing physician: Deitra Mayo MD Electronically signed by Deitra Mayo MD on 05/15/2020 at 1:52:47 PM.    Final      Labs:   Basic Metabolic Panel: Recent Labs  Lab 05/14/20 2151 05/15/20 1244 05/17/20 0630  NA 138 138 138  K 3.3* 3.4* 3.6  CL 100 103 106  CO2 26 25 22   GLUCOSE 106* 102* 101*  BUN 13 12 11   CREATININE 1.02* 0.73 0.66  CALCIUM 9.4 8.7*  8.6*   GFR Estimated Creatinine Clearance: 78.5 mL/min (by C-G formula based on SCr of 0.66 mg/dL). Liver Function Tests: Recent Labs  Lab 05/14/20 2151  AST 37  ALT 52*  ALKPHOS 70  BILITOT 0.4  PROT 7.4  ALBUMIN 3.9   No results for input(s): LIPASE, AMYLASE in the last 168 hours. No results for input(s): AMMONIA in the last 168 hours. Coagulation profile No results for input(s): INR, PROTIME in the last 168 hours.  CBC: Recent Labs  Lab 05/14/20 2151 05/15/20 1244 05/17/20 0630  WBC 12.5* 13.7* 7.2  NEUTROABS 10.2*  --  3.8  HGB 14.4 12.3 11.3*  HCT 42.6 36.6 34.6*  MCV 88.6 89.9 90.1  PLT 230 213 206   Cardiac Enzymes: No results for input(s): CKTOTAL, CKMB, CKMBINDEX, TROPONINI in the last 168 hours. BNP: Invalid input(s): POCBNP CBG: No results for input(s): GLUCAP in the last 168 hours. D-Dimer Recent Labs    05/14/20 2151  DDIMER 3.44*   Hgb A1c No results for input(s): HGBA1C in the last 72 hours. Lipid Profile Recent Labs    05/15/20 1244  CHOL 161  HDL 41  LDLCALC 88  TRIG 158*  CHOLHDL 3.9   Thyroid function  studies No results for input(s): TSH, T4TOTAL, T3FREE, THYROIDAB in the last 72 hours.  Invalid input(s): FREET3 Anemia work up No results for input(s): VITAMINB12, FOLATE, FERRITIN, TIBC, IRON, RETICCTPCT in the last 72 hours. Microbiology Recent Results (from the past 240 hour(s))  Resp Panel by RT-PCR (Flu A&B, Covid) Nasopharyngeal Swab     Status: None   Collection Time: 05/14/20 11:30 PM   Specimen: Nasopharyngeal Swab; Nasopharyngeal(NP) swabs in vial transport medium  Result Value Ref Range Status   SARS Coronavirus 2 by RT PCR NEGATIVE NEGATIVE Final    Comment: (NOTE) SARS-CoV-2 target nucleic acids are NOT DETECTED.  The SARS-CoV-2 RNA is generally detectable in upper respiratory specimens during the acute phase of infection. The lowest concentration of SARS-CoV-2 viral copies this assay can detect is 138  copies/mL. A negative result does not preclude SARS-Cov-2 infection and should not be used as the sole basis for treatment or other patient management decisions. A negative result may occur with  improper specimen collection/handling, submission of specimen other than nasopharyngeal swab, presence of viral mutation(s) within the areas targeted by this assay, and inadequate number of viral copies(<138 copies/mL). A negative result must be combined with clinical observations, patient history, and epidemiological information. The expected result is Negative.  Fact Sheet for Patients:  EntrepreneurPulse.com.au  Fact Sheet for Healthcare Providers:  IncredibleEmployment.be  This test is no t yet approved or cleared by the [place] FDA and  has been authorized for detection and/or diagnosis of SARS-CoV-2 by FDA under an Emergency Use Authorization (EUA). This EUA will remain  in effect (meaning this test can be used) for the duration of the COVID-19 declaration under Section 564(b)(1) of the Act, 21 U.S.C.section 360bbb-3(b)(1), unless the authorization is terminated  or revoked sooner.       Influenza A by PCR NEGATIVE NEGATIVE Final   Influenza B by PCR NEGATIVE NEGATIVE Final    Comment: (NOTE) The Xpert Xpress SARS-CoV-2/FLU/RSV plus assay is intended as an aid in the diagnosis of influenza from Nasopharyngeal swab specimens and should not be used as a sole basis for treatment. Nasal washings and aspirates are unacceptable for Xpert Xpress SARS-CoV-2/FLU/RSV testing.  Fact Sheet for Patients: EntrepreneurPulse.com.au  Fact Sheet for Healthcare Providers: IncredibleEmployment.be  This test is not yet approved or cleared by the [place] FDA and has been authorized for detection and/or diagnosis of SARS-CoV-2 by FDA under an Emergency Use Authorization (EUA). This EUA will remain in effect (meaning  this test can be used) for the duration of the COVID-19 declaration under Section 564(b)(1) of the Act, 21 U.S.C. section 360bbb-3(b)(1), unless the authorization is terminated or revoked.  Performed at [facility], [address] [place], [place] [address]      Signed: Terrilee Croak  [facility] 05/17/2020, 3:38 PM

## 2020-05-17 NOTE — Progress Notes (Signed)
Physician Discharge Summary  Donna Pace YME:[id number] DOB: April 09, 1960 DOA: 05/14/2020  PCP: System, Provider Not In  Admit date: 05/14/2020 Discharge date: 05/17/2020  Admitted From: home Discharge disposition: home   Code Status: Partial Code  Diet Recommendation: cardiac diet  Discharge Diagnosis:   Principal Problem:   Sepsis ([place]) Active Problems:   Hypothyroid   HTN (hypertension)   Asthma   HX: breast cancer   Hyperlipidemia, unspecified   Aortic atherosclerosis ([place])   Edema of right upper arm  Chief Complaint  Patient presents with  . Shortness of Breath   Brief narrative: Donna Pace is a 60 y.o. female with PMH significant for hypertension, hyperlipidemia, distant tobacco use, mitral valve regurgitation, hypothyroidism, asthma and COPD, right breast cancer s/p lumpectomy with RUE lymphedema.   Patient presented to the ED on 4/8 for evaluation of shortness of breath while at work associated with chest pain, lightheadedness, warmth and dizzy feeling.  Says that she has been using her inhaler 3-4 times daily this week as opposed to 1-2 times daily on a regular basis.  Associated with some fatigue for the past 2 days without any fever, cough or congestion.  Additionally has had swelling in her right arm which does occasionally get swollen though more swollen, warm and erythematous which is new.  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 Says her breast cancer is in remission. No history of blood clots, no recent travel or sick contacts.  In the ED, patient was initially afebrile but per ER documentation, she spiked a fever of 102.8 later. heart rate more than 100, blood pressure in low 100s, breathing on room air Lab with WC count elevated to 12.5, potassium low at 3.3, lactic acid normal at 1.7, D-dimer elevated to 3.44. CTA chest negative for pulmonary embolism Upper extremity venous duplex negative  for DVT. Admitted to hospital service for further evaluation management.  Subjective: Patient was seen and examined this morning. Pleasant middle-aged African-American female.  Sitting up in chair.  Taking her breakfast.  Not in distress.  Right arm swelling, pain improving.  Hospital course: Sepsis - POA likely due to nonpurulent right upper extremity cellulitis -Presented with fever, tachycardia, tachypnea, leukocytosis, redness, warmth and worsening swelling of right upper extremity. -Patient has a history of right-sided breast cancer status post lumpectomy and lymph node dissection after which she has chronic right upper extremity lymphedema. -No evidence of DVT on ultrasound -Clinically improving on IV Rocephin for cellulitis.  Discharge home on 3 more days of oral Omnicef with probiotics. -Advised for compression pending of right upper extremity Recent Labs  Lab 05/14/20 2151 05/15/20 0854 05/15/20 1244 05/17/20 0630  WBC 12.5*  --  13.7* 7.2  LATICACIDVEN 1.7 1.9  --   --   PROCALCITON  --   --  1.78  --    Dyspnea -Patient presented with symptoms of dyspnea, chest pain, lightheadedness while at work. -CT angio of chest did not show any intrathoracic pathology. -Asthma attack versus anxiety -Currently not in respiratory distress -Continue as needed albuterol  Hypothyroid Continue home levothyroxine  Hypertension -Home meds include HCTZ 25 mg daily.  Okay to resume post discharge.  Hyperlipidemia  Aortic atherosclerosis -Not on a statin.  HDL and LDL controlled  Hx of right sided breast cancer s/p lumpectomy with recurrent RUE lymphedema -In remission  Stable to discharge home today.   Wound care:    Discharge  Exam:   Vitals:   05/16/20 0512 05/16/20 1403 05/16/20 2333 05/17/20 0428  BP: 134/69 138/73 (!) 152/73 132/70  Pulse: 81 83 77 73  Resp: 18 18 18 16   Temp: 98 F (36.7 C) 98.3 F (36.8 C) 98.4 F (36.9 C) 97.6 F (36.4 C)  TempSrc: Oral  Oral Oral Oral  SpO2: 92% 95% 97% 100%  Weight:      Height:        Body mass index is 28.25 kg/m.  General exam: Pleasant, middle-aged African-American female.  Not in distress Skin: No rashes, lesions or ulcers. HEENT: Atraumatic, normocephalic, no obvious bleeding Lungs: Clear to auscultation bilaterally CVS: Regular rate and rhythm, no murmur GI/Abd soft, nontender, nondistended, bowel sound present CNS: Alert, awake, oriented x3 Psychiatry: Mood appropriate Extremities: No pedal edema, no calf tenderness.  Right upper extremity is chronically swollen because of lymphedema.  Gradually improving swelling and pain.  Follow ups:   Discharge Instructions    Diet - low sodium heart healthy   Complete by: As directed    Increase activity slowly   Complete by: As directed       Follow-up Information    [facility].   Contact information: [address] [place] [address] [phone]              Recommendations for Outpatient Follow-Up:   1. Follow-up with PCP as an outpatient 2. Follow-up with oncology as an outpatient  Discharge Instructions:  Follow with Primary MD System, Provider Not In in 7 days   Get CBC/BMP checked in next visit within 1 week by PCP or SNF MD ( we routinely change or add medications that can affect your baseline labs and fluid status, therefore we recommend that you get the mentioned basic workup next visit with your PCP, your PCP may decide not to get them or add new tests based on their clinical decision)  On your next visit with your PCP, please Get Medicines reviewed and adjusted.  Please request your PCP  to go over all Hospital Tests and Procedure/Radiological results at the follow up, please get all Hospital records sent to your Prim MD by signing hospital release before you go home.  Activity: As tolerated with Full fall precautions use walker/cane & assistance as  needed  For Heart failure patients - Check your Weight same time everyday, if you gain over 2 pounds, or you develop in leg swelling, experience more shortness of breath or chest pain, call your Primary MD immediately. Follow Cardiac Low Salt Diet and 1.5 lit/day fluid restriction.  If you have smoked or chewed Tobacco in the last 2 yrs please stop smoking, stop any regular Alcohol  and or any Recreational drug use.  If you experience worsening of your admission symptoms, develop shortness of breath, life threatening emergency, suicidal or homicidal thoughts you must seek medical attention immediately by calling 911 or calling your MD immediately  if symptoms less severe.  You Must read complete instructions/literature along with all the possible adverse reactions/side effects for all the Medicines you take and that have been prescribed to you. Take any new Medicines after you have completely understood and accpet all the possible adverse reactions/side effects.   Do not drive, operate heavy machinery, perform activities at heights, swimming or participation in water activities or provide baby sitting services if your were admitted for syncope or siezures until you have seen by Primary MD or a Neurologist  and advised to do so again.  Do not drive when taking Pain medications.  Do not take more than prescribed Pain, Sleep and Anxiety Medications  Wear Seat belts while driving.   Please note You were cared for by a hospitalist during your hospital stay. If you have any questions about your discharge medications or the care you received while you were in the hospital after you are discharged, you can call the unit and asked to speak with the hospitalist on call if the hospitalist that took care of you is not available. Once you are discharged, your primary care physician will handle any further medical issues. Please note that NO REFILLS for any discharge medications will be authorized once you are  discharged, as it is imperative that you return to your primary care physician (or establish a relationship with a primary care physician if you do not have one) for your aftercare needs so that they can reassess your need for medications and monitor your lab values.    Allergies as of 05/17/2020      Reactions   Ace Inhibitors Itching, Other (See Comments)   Cough Cough Cough Cough   Penicillins Rash      Medication List    STOP taking these medications   acetaminophen 500 MG tablet Commonly known as: TYLENOL   lisinopril-hydrochlorothiazide 20-25 MG tablet Commonly known as: ZESTORETIC     TAKE these medications   albuterol 108 (90 Base) MCG/ACT inhaler Commonly known as: Ventolin HFA Inhale 2 puffs into the lungs every 6 (six) hours as needed.   Calcium Carbonate-Vitamin D 600-400 MG-UNIT tablet Commonly known as: Caltrate 600+D Take 1 tablet by mouth 2 (two) times daily.   cefdinir 300 MG capsule Commonly known as: OMNICEF Take 1 capsule (300 mg total) by mouth 2 (two) times daily for 3 days.   hydrochlorothiazide 25 MG tablet Commonly known as: HYDRODIURIL Take 25 mg by mouth daily.   HYDROcodone-acetaminophen 5-325 MG tablet Commonly known as: NORCO/VICODIN Take 1 tablet by mouth every 6 (six) hours as needed for up to 3 days for moderate pain.   levothyroxine 175 MCG tablet Commonly known as: SYNTHROID Take 175 mcg by mouth daily before breakfast. What changed: Another medication with the same name was removed. Continue taking this medication, and follow the directions you see here.   montelukast 10 MG tablet Commonly known as: SINGULAIR Take 10 mg by mouth daily.   predniSONE 20 MG tablet Commonly known as: DELTASONE Take 20 mg by mouth See admin instructions. Taper, take 2 tablets for 2 days, then 1.5 tablets for 2 days, then one tablet for 2 days   saccharomyces boulardii 250 MG capsule Commonly known as: FLORASTOR Take 1 capsule (250 mg total) by  mouth 2 (two) times daily for 5 days.   Trelegy Ellipta 200-62.5-25 MCG/INH Aepb Generic drug: Fluticasone-Umeclidin-Vilant Inhale 1 puff into the lungs daily.   Vitamin D (Ergocalciferol) 1.25 MG (50000 UNIT) Caps capsule Commonly known as: DRISDOL Take 1 capsule by mouth once a week.       Time coordinating discharge: 35 minutes  The results of significant diagnostics from this hospitalization (including imaging, microbiology, ancillary and laboratory) are listed below for reference.    Procedures and Diagnostic Studies:   CT Angio Chest PE W and/or Wo Contrast  Result Date: 05/14/2020 CLINICAL DATA:  Positive D-dimer.  Shortness of breath. EXAM: CT ANGIOGRAPHY CHEST WITH CONTRAST TECHNIQUE: Multidetector CT imaging of the chest was performed using the standard protocol  during bolus administration of intravenous contrast. Multiplanar CT image reconstructions and MIPs were obtained to evaluate the vascular anatomy. CONTRAST:  133mL OMNIPAQUE IOHEXOL 350 MG/ML SOLN COMPARISON:  None. FINDINGS: Cardiovascular: Contrast injection is sufficient to demonstrate satisfactory opacification of the pulmonary arteries to the segmental level. There is no pulmonary embolus or evidence of right heart strain. The size of the main pulmonary artery is normal. Heart size is normal, with no pericardial effusion. The course and caliber of the aorta are normal. There is mild atherosclerotic calcification. Opacification decreased due to pulmonary arterial phase contrast bolus timing. Mediastinum/Nodes: No mediastinal, hilar or axillary lymphadenopathy. Normal visualized thyroid. Thoracic esophageal course is normal. Lungs/Pleura: Airways are patent. No pleural effusion, lobar consolidation, pneumothorax or pulmonary infarction. Upper Abdomen: Contrast bolus timing is not optimized for evaluation of the abdominal organs. The visualized portions of the organs of the upper abdomen are normal. Musculoskeletal: No chest  wall abnormality. No bony spinal canal stenosis. Review of the MIP images confirms the above findings. IMPRESSION: 1. No pulmonary embolus or other acute thoracic abnormality. Aortic Atherosclerosis (ICD10-I70.0). Electronically Signed   By: Ulyses Jarred M.D.   On: 05/14/2020 23:38   DG Chest Portable 1 View  Result Date: 05/14/2020 CLINICAL DATA:  Shortness of breath EXAM: PORTABLE CHEST 1 VIEW COMPARISON:  12/10/2016 FINDINGS: The heart size and mediastinal contours are within normal limits. Both lungs are clear. The visualized skeletal structures are unremarkable. IMPRESSION: No active disease. Electronically Signed   By: Donavan Foil M.D.   On: 05/14/2020 22:17   VAS [place] UPPER EXTREMITY VENOUS DUPLEX  Result Date: 05/15/2020 UPPER VENOUS STUDY  Indications: Edema, and SOB Risk Factors: Cancer HX of right breast CA with lumpectomy and RUE lymphedema. Astma, COPD, MVR. Limitations: Edema. Comparison Study: Previous exam 12/30.09 - negative Performing Technologist: Rogelia Rohrer  Examination Guidelines: A complete evaluation includes B-mode imaging, spectral Doppler, color Doppler, and power Doppler as needed of all accessible portions of each vessel. Bilateral testing is considered an integral part of a complete examination. Limited examinations for reoccurring indications may be performed as noted.  Right Findings: +----------+------------+---------+-----------+----------+-------------------+ RIGHT     CompressiblePhasicitySpontaneousProperties      Summary       +----------+------------+---------+-----------+----------+-------------------+ IJV           Full       Yes       Yes                                  +----------+------------+---------+-----------+----------+-------------------+ Subclavian    Full       Yes       Yes                                  +----------+------------+---------+-----------+----------+-------------------+ Axillary      Full       Yes       Yes                                   +----------+------------+---------+-----------+----------+-------------------+ Brachial      Full       Yes       Yes                                  +----------+------------+---------+-----------+----------+-------------------+  Radial        Full                                                      +----------+------------+---------+-----------+----------+-------------------+ Ulnar         Full                                  Not well visualized +----------+------------+---------+-----------+----------+-------------------+ Cephalic      Full                                  Not well visualized +----------+------------+---------+-----------+----------+-------------------+ Basilic       Full       Yes       Yes                                  +----------+------------+---------+-----------+----------+-------------------+ Subcutaneous edema throughout RUE  Left Findings: +----------+------------+---------+-----------+----------+-------+ LEFT      CompressiblePhasicitySpontaneousPropertiesSummary +----------+------------+---------+-----------+----------+-------+ Subclavian    Full       Yes       Yes                      +----------+------------+---------+-----------+----------+-------+  Summary:  Right: No evidence of deep vein thrombosis in the upper extremity. No evidence of superficial vein thrombosis in the upper extremity.  Left: No evidence of thrombosis in the subclavian.  *See table(s) above for measurements and observations.  Diagnosing physician: Deitra Mayo MD Electronically signed by Deitra Mayo MD on 05/15/2020 at 1:52:47 PM.    Final      Labs:   Basic Metabolic Panel: Recent Labs  Lab 05/14/20 2151 05/15/20 1244 05/17/20 0630  NA 138 138 138  K 3.3* 3.4* 3.6  CL 100 103 106  CO2 26 25 22   GLUCOSE 106* 102* 101*  BUN 13 12 11   CREATININE 1.02* 0.73 0.66  CALCIUM 9.4 8.7* 8.6*   GFR Estimated  Creatinine Clearance: 78.5 mL/min (by C-G formula based on SCr of 0.66 mg/dL). Liver Function Tests: Recent Labs  Lab 05/14/20 2151  AST 37  ALT 52*  ALKPHOS 70  BILITOT 0.4  PROT 7.4  ALBUMIN 3.9   No results for input(s): LIPASE, AMYLASE in the last 168 hours. No results for input(s): AMMONIA in the last 168 hours. Coagulation profile No results for input(s): INR, PROTIME in the last 168 hours.  CBC: Recent Labs  Lab 05/14/20 2151 05/15/20 1244 05/17/20 0630  WBC 12.5* 13.7* 7.2  NEUTROABS 10.2*  --  3.8  HGB 14.4 12.3 11.3*  HCT 42.6 36.6 34.6*  MCV 88.6 89.9 90.1  PLT 230 213 206   Cardiac Enzymes: No results for input(s): CKTOTAL, CKMB, CKMBINDEX, TROPONINI in the last 168 hours. BNP: Invalid input(s): POCBNP CBG: No results for input(s): GLUCAP in the last 168 hours. D-Dimer Recent Labs    05/14/20 2151  DDIMER 3.44*   Hgb A1c No results for input(s): HGBA1C in the last 72 hours. Lipid Profile Recent Labs    05/15/20 1244  CHOL 161  HDL 41  LDLCALC 88  TRIG 158*  CHOLHDL 3.9   Thyroid function  studies No results for input(s): TSH, T4TOTAL, T3FREE, THYROIDAB in the last 72 hours.  Invalid input(s): FREET3 Anemia work up No results for input(s): VITAMINB12, FOLATE, FERRITIN, TIBC, IRON, RETICCTPCT in the last 72 hours. Microbiology Recent Results (from the past 240 hour(s))  Resp Panel by RT-PCR (Flu A&B, Covid) Nasopharyngeal Swab     Status: None   Collection Time: 05/14/20 11:30 PM   Specimen: Nasopharyngeal Swab; Nasopharyngeal(NP) swabs in vial transport medium  Result Value Ref Range Status   SARS Coronavirus 2 by RT PCR NEGATIVE NEGATIVE Final    Comment: (NOTE) SARS-CoV-2 target nucleic acids are NOT DETECTED.  The SARS-CoV-2 RNA is generally detectable in upper respiratory specimens during the acute phase of infection. The lowest concentration of SARS-CoV-2 viral copies this assay can detect is 138 copies/mL. A negative result does  not preclude SARS-Cov-2 infection and should not be used as the sole basis for treatment or other patient management decisions. A negative result may occur with  improper specimen collection/handling, submission of specimen other than nasopharyngeal swab, presence of viral mutation(s) within the areas targeted by this assay, and inadequate number of viral copies(<138 copies/mL). A negative result must be combined with clinical observations, patient history, and epidemiological information. The expected result is Negative.  Fact Sheet for Patients:  EntrepreneurPulse.com.au  Fact Sheet for Healthcare Providers:  IncredibleEmployment.be  This test is no t yet approved or cleared by the [place] FDA and  has been authorized for detection and/or diagnosis of SARS-CoV-2 by FDA under an Emergency Use Authorization (EUA). This EUA will remain  in effect (meaning this test can be used) for the duration of the COVID-19 declaration under Section 564(b)(1) of the Act, 21 U.S.C.section 360bbb-3(b)(1), unless the authorization is terminated  or revoked sooner.       Influenza A by PCR NEGATIVE NEGATIVE Final   Influenza B by PCR NEGATIVE NEGATIVE Final    Comment: (NOTE) The Xpert Xpress SARS-CoV-2/FLU/RSV plus assay is intended as an aid in the diagnosis of influenza from Nasopharyngeal swab specimens and should not be used as a sole basis for treatment. Nasal washings and aspirates are unacceptable for Xpert Xpress SARS-CoV-2/FLU/RSV testing.  Fact Sheet for Patients: EntrepreneurPulse.com.au  Fact Sheet for Healthcare Providers: IncredibleEmployment.be  This test is not yet approved or cleared by the [place] FDA and has been authorized for detection and/or diagnosis of SARS-CoV-2 by FDA under an Emergency Use Authorization (EUA). This EUA will remain in effect (meaning this test can be used) for the  duration of the COVID-19 declaration under Section 564(b)(1) of the Act, 21 U.S.C. section 360bbb-3(b)(1), unless the authorization is terminated or revoked.  Performed at [facility], [address] [place], [place] [address]      Signed: Terrilee Croak  [facility] 05/17/2020, 10:47 AM

## 2020-05-17 NOTE — Progress Notes (Signed)
Physician Discharge Summary  Donna Pace QZE:[id number] DOB: 06/11/1960 DOA: 05/14/2020  PCP: System, Provider Not In  Admit date: 05/14/2020 Discharge date: 05/17/2020  Admitted From: home Discharge disposition: home   Code Status: Partial Code  Diet Recommendation: cardiac diet  Discharge Diagnosis:   Principal Problem:   Sepsis ([facility]) Active Problems:   Hypothyroid   HTN (hypertension)   Asthma   HX: breast cancer   Hyperlipidemia, unspecified   Aortic atherosclerosis ([employer])   Edema of right upper arm  Chief Complaint  Patient presents with  . Shortness of Breath   Brief narrative: Donna Pace is a 60 y.o. female with PMH significant for hypertension, hyperlipidemia, distant tobacco use, mitral valve regurgitation, hypothyroidism, asthma and COPD, right breast cancer s/p lumpectomy with RUE lymphedema.   Patient presented to the ED on 4/8 for evaluation of shortness of breath while at work associated with chest pain, lightheadedness, warmth and dizzy feeling.  Says that she has been using her inhaler 3-4 times daily this week as opposed to 1-2 times daily on a regular basis.  Associated with some fatigue for the past 2 days without any fever, cough or congestion.  Additionally has had swelling in her right arm which does occasionally get swollen though more swollen, warm and erythematous which is new.  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 Says her breast cancer is in remission. No history of blood clots, no recent travel or sick contacts.  In the ED, patient was initially afebrile but per ER documentation, she spiked a fever of 102.8 later. heart rate more than 100, blood pressure in low 100s, breathing on room air Lab with WC count elevated to 12.5, potassium low at 3.3, lactic acid normal at 1.7, D-dimer elevated to 3.44. CTA chest negative for pulmonary embolism Upper extremity venous duplex negative  for DVT. Admitted to hospital service for further evaluation management.  Subjective: Patient was seen and examined this morning. Pleasant middle-aged African-American female.  Sitting up in chair.  Taking her breakfast.  Not in distress.  Right arm swelling, pain improving.  Hospital course: Sepsis - POA likely due to nonpurulent right upper extremity cellulitis -Presented with fever, tachycardia, tachypnea, leukocytosis, redness, warmth and worsening swelling of right upper extremity. -Patient has a history of right-sided breast cancer status post lumpectomy and lymph node dissection after which she has chronic right upper extremity lymphedema. -No evidence of DVT on ultrasound -Clinically improving on IV Rocephin for cellulitis.  Discharge home on 3 more days of oral Omnicef with probiotics. -Advised for compression pending of right upper extremity Recent Labs  Lab 05/14/20 2151 05/15/20 0854 05/15/20 1244 05/17/20 0630  WBC 12.5*  --  13.7* 7.2  LATICACIDVEN 1.7 1.9  --   --   PROCALCITON  --   --  1.78  --    Dyspnea -Patient presented with symptoms of dyspnea, chest pain, lightheadedness while at work. -CT angio of chest did not show any intrathoracic pathology. -Asthma attack versus anxiety -Currently not in respiratory distress -Continue as needed albuterol  Hypothyroid Continue home levothyroxine  Hypertension -Home meds include HCTZ 25 mg daily.  Okay to resume post discharge.  Hyperlipidemia  Aortic atherosclerosis -Not on a statin.  HDL and LDL controlled  Hx of right sided breast cancer s/p lumpectomy with recurrent RUE lymphedema -In remission  Stable to discharge home today.   Wound care:    Discharge  Exam:   Vitals:   05/16/20 0512 05/16/20 1403 05/16/20 2333 05/17/20 0428  BP: 134/69 138/73 (!) 152/73 132/70  Pulse: 81 83 77 73  Resp: 18 18 18 16   Temp: 98 F (36.7 C) 98.3 F (36.8 C) 98.4 F (36.9 C) 97.6 F (36.4 C)  TempSrc: Oral  Oral Oral Oral  SpO2: 92% 95% 97% 100%  Weight:      Height:        Body mass index is 28.25 kg/m.  General exam: Pleasant, middle-aged African-American female.  Not in distress Skin: No rashes, lesions or ulcers. HEENT: Atraumatic, normocephalic, no obvious bleeding Lungs: Clear to auscultation bilaterally CVS: Regular rate and rhythm, no murmur GI/Abd soft, nontender, nondistended, bowel sound present CNS: Alert, awake, oriented x3 Psychiatry: Mood appropriate Extremities: No pedal edema, no calf tenderness.  Right upper extremity is chronically swollen because of lymphedema.  Gradually improving swelling and pain.  Follow ups:   Discharge Instructions    Diet - low sodium heart healthy   Complete by: As directed    Increase activity slowly   Complete by: As directed       Follow-up Information    [facility].   Contact information: [address] [place] [address] [phone]              Recommendations for Outpatient Follow-Up:   1. Follow-up with PCP as an outpatient 2. Follow-up with oncology as an outpatient  Discharge Instructions:  Follow with Primary MD System, Provider Not In in 7 days   Get CBC/BMP checked in next visit within 1 week by PCP or SNF MD ( we routinely change or add medications that can affect your baseline labs and fluid status, therefore we recommend that you get the mentioned basic workup next visit with your PCP, your PCP may decide not to get them or add new tests based on their clinical decision)  On your next visit with your PCP, please Get Medicines reviewed and adjusted.  Please request your PCP  to go over all Hospital Tests and Procedure/Radiological results at the follow up, please get all Hospital records sent to your Prim MD by signing hospital release before you go home.  Activity: As tolerated with Full fall precautions use walker/cane & assistance as  needed  For Heart failure patients - Check your Weight same time everyday, if you gain over 2 pounds, or you develop in leg swelling, experience more shortness of breath or chest pain, call your Primary MD immediately. Follow Cardiac Low Salt Diet and 1.5 lit/day fluid restriction.  If you have smoked or chewed Tobacco in the last 2 yrs please stop smoking, stop any regular Alcohol  and or any Recreational drug use.  If you experience worsening of your admission symptoms, develop shortness of breath, life threatening emergency, suicidal or homicidal thoughts you must seek medical attention immediately by calling 911 or calling your MD immediately  if symptoms less severe.  You Must read complete instructions/literature along with all the possible adverse reactions/side effects for all the Medicines you take and that have been prescribed to you. Take any new Medicines after you have completely understood and accpet all the possible adverse reactions/side effects.   Do not drive, operate heavy machinery, perform activities at heights, swimming or participation in water activities or provide baby sitting services if your were admitted for syncope or siezures until you have seen by Primary MD or a Neurologist  and advised to do so again.  Do not drive when taking Pain medications.  Do not take more than prescribed Pain, Sleep and Anxiety Medications  Wear Seat belts while driving.   Please note You were cared for by a hospitalist during your hospital stay. If you have any questions about your discharge medications or the care you received while you were in the hospital after you are discharged, you can call the unit and asked to speak with the hospitalist on call if the hospitalist that took care of you is not available. Once you are discharged, your primary care physician will handle any further medical issues. Please note that NO REFILLS for any discharge medications will be authorized once you are  discharged, as it is imperative that you return to your primary care physician (or establish a relationship with a primary care physician if you do not have one) for your aftercare needs so that they can reassess your need for medications and monitor your lab values.    Allergies as of 05/17/2020      Reactions   Ace Inhibitors Itching, Other (See Comments)   Cough Cough Cough Cough   Penicillins Rash      Medication List    STOP taking these medications   acetaminophen 500 MG tablet Commonly known as: TYLENOL   lisinopril-hydrochlorothiazide 20-25 MG tablet Commonly known as: ZESTORETIC     TAKE these medications   albuterol 108 (90 Base) MCG/ACT inhaler Commonly known as: Ventolin HFA Inhale 2 puffs into the lungs every 6 (six) hours as needed.   Calcium Carbonate-Vitamin D 600-400 MG-UNIT tablet Commonly known as: Caltrate 600+D Take 1 tablet by mouth 2 (two) times daily.   cefdinir 300 MG capsule Commonly known as: OMNICEF Take 1 capsule (300 mg total) by mouth 2 (two) times daily for 3 days.   hydrochlorothiazide 25 MG tablet Commonly known as: HYDRODIURIL Take 25 mg by mouth daily.   HYDROcodone-acetaminophen 5-325 MG tablet Commonly known as: NORCO/VICODIN Take 1 tablet by mouth every 6 (six) hours as needed for up to 3 days for moderate pain.   levothyroxine 175 MCG tablet Commonly known as: SYNTHROID Take 175 mcg by mouth daily before breakfast. What changed: Another medication with the same name was removed. Continue taking this medication, and follow the directions you see here.   montelukast 10 MG tablet Commonly known as: SINGULAIR Take 10 mg by mouth daily.   predniSONE 20 MG tablet Commonly known as: DELTASONE Take 20 mg by mouth See admin instructions. Taper, take 2 tablets for 2 days, then 1.5 tablets for 2 days, then one tablet for 2 days   saccharomyces boulardii 250 MG capsule Commonly known as: FLORASTOR Take 1 capsule (250 mg total) by  mouth 2 (two) times daily for 5 days.   Trelegy Ellipta 200-62.5-25 MCG/INH Aepb Generic drug: Fluticasone-Umeclidin-Vilant Inhale 1 puff into the lungs daily.   Vitamin D (Ergocalciferol) 1.25 MG (50000 UNIT) Caps capsule Commonly known as: DRISDOL Take 1 capsule by mouth once a week.       Time coordinating discharge: 35 minutes  The results of significant diagnostics from this hospitalization (including imaging, microbiology, ancillary and laboratory) are listed below for reference.    Procedures and Diagnostic Studies:   CT Angio Chest PE W and/or Wo Contrast  Result Date: 05/14/2020 CLINICAL DATA:  Positive D-dimer.  Shortness of breath. EXAM: CT ANGIOGRAPHY CHEST WITH CONTRAST TECHNIQUE: Multidetector CT imaging of the chest was performed using the standard protocol  during bolus administration of intravenous contrast. Multiplanar CT image reconstructions and MIPs were obtained to evaluate the vascular anatomy. CONTRAST:  126mL OMNIPAQUE IOHEXOL 350 MG/ML SOLN COMPARISON:  None. FINDINGS: Cardiovascular: Contrast injection is sufficient to demonstrate satisfactory opacification of the pulmonary arteries to the segmental level. There is no pulmonary embolus or evidence of right heart strain. The size of the main pulmonary artery is normal. Heart size is normal, with no pericardial effusion. The course and caliber of the aorta are normal. There is mild atherosclerotic calcification. Opacification decreased due to pulmonary arterial phase contrast bolus timing. Mediastinum/Nodes: No mediastinal, hilar or axillary lymphadenopathy. Normal visualized thyroid. Thoracic esophageal course is normal. Lungs/Pleura: Airways are patent. No pleural effusion, lobar consolidation, pneumothorax or pulmonary infarction. Upper Abdomen: Contrast bolus timing is not optimized for evaluation of the abdominal organs. The visualized portions of the organs of the upper abdomen are normal. Musculoskeletal: No chest  wall abnormality. No bony spinal canal stenosis. Review of the MIP images confirms the above findings. IMPRESSION: 1. No pulmonary embolus or other acute thoracic abnormality. Aortic Atherosclerosis (ICD10-I70.0). Electronically Signed   By: Ulyses Jarred M.D.   On: 05/14/2020 23:38   DG Chest Portable 1 View  Result Date: 05/14/2020 CLINICAL DATA:  Shortness of breath EXAM: PORTABLE CHEST 1 VIEW COMPARISON:  12/10/2016 FINDINGS: The heart size and mediastinal contours are within normal limits. Both lungs are clear. The visualized skeletal structures are unremarkable. IMPRESSION: No active disease. Electronically Signed   By: Donavan Foil M.D.   On: 05/14/2020 22:17   VAS [place] UPPER EXTREMITY VENOUS DUPLEX  Result Date: 05/15/2020 UPPER VENOUS STUDY  Indications: Edema, and SOB Risk Factors: Cancer HX of right breast CA with lumpectomy and RUE lymphedema. Astma, COPD, MVR. Limitations: Edema. Comparison Study: Previous exam 12/30.09 - negative Performing Technologist: Rogelia Rohrer  Examination Guidelines: A complete evaluation includes B-mode imaging, spectral Doppler, color Doppler, and power Doppler as needed of all accessible portions of each vessel. Bilateral testing is considered an integral part of a complete examination. Limited examinations for reoccurring indications may be performed as noted.  Right Findings: +----------+------------+---------+-----------+----------+-------------------+ RIGHT     CompressiblePhasicitySpontaneousProperties      Summary       +----------+------------+---------+-----------+----------+-------------------+ IJV           Full       Yes       Yes                                  +----------+------------+---------+-----------+----------+-------------------+ Subclavian    Full       Yes       Yes                                  +----------+------------+---------+-----------+----------+-------------------+ Axillary      Full       Yes       Yes                                   +----------+------------+---------+-----------+----------+-------------------+ Brachial      Full       Yes       Yes                                  +----------+------------+---------+-----------+----------+-------------------+  Radial        Full                                                      +----------+------------+---------+-----------+----------+-------------------+ Ulnar         Full                                  Not well visualized +----------+------------+---------+-----------+----------+-------------------+ Cephalic      Full                                  Not well visualized +----------+------------+---------+-----------+----------+-------------------+ Basilic       Full       Yes       Yes                                  +----------+------------+---------+-----------+----------+-------------------+ Subcutaneous edema throughout RUE  Left Findings: +----------+------------+---------+-----------+----------+-------+ LEFT      CompressiblePhasicitySpontaneousPropertiesSummary +----------+------------+---------+-----------+----------+-------+ Subclavian    Full       Yes       Yes                      +----------+------------+---------+-----------+----------+-------+  Summary:  Right: No evidence of deep vein thrombosis in the upper extremity. No evidence of superficial vein thrombosis in the upper extremity.  Left: No evidence of thrombosis in the subclavian.  *See table(s) above for measurements and observations.  Diagnosing physician: Deitra Mayo MD Electronically signed by Deitra Mayo MD on 05/15/2020 at 1:52:47 PM.    Final      Labs:   Basic Metabolic Panel: Recent Labs  Lab 05/14/20 2151 05/15/20 1244 05/17/20 0630  NA 138 138 138  K 3.3* 3.4* 3.6  CL 100 103 106  CO2 26 25 22   GLUCOSE 106* 102* 101*  BUN 13 12 11   CREATININE 1.02* 0.73 0.66  CALCIUM 9.4 8.7* 8.6*   GFR Estimated  Creatinine Clearance: 78.5 mL/min (by C-G formula based on SCr of 0.66 mg/dL). Liver Function Tests: Recent Labs  Lab 05/14/20 2151  AST 37  ALT 52*  ALKPHOS 70  BILITOT 0.4  PROT 7.4  ALBUMIN 3.9   No results for input(s): LIPASE, AMYLASE in the last 168 hours. No results for input(s): AMMONIA in the last 168 hours. Coagulation profile No results for input(s): INR, PROTIME in the last 168 hours.  CBC: Recent Labs  Lab 05/14/20 2151 05/15/20 1244 05/17/20 0630  WBC 12.5* 13.7* 7.2  NEUTROABS 10.2*  --  3.8  HGB 14.4 12.3 11.3*  HCT 42.6 36.6 34.6*  MCV 88.6 89.9 90.1  PLT 230 213 206   Cardiac Enzymes: No results for input(s): CKTOTAL, CKMB, CKMBINDEX, TROPONINI in the last 168 hours. BNP: Invalid input(s): POCBNP CBG: No results for input(s): GLUCAP in the last 168 hours. D-Dimer Recent Labs    05/14/20 2151  DDIMER 3.44*   Hgb A1c No results for input(s): HGBA1C in the last 72 hours. Lipid Profile Recent Labs    05/15/20 1244  CHOL 161  HDL 41  LDLCALC 88  TRIG 158*  CHOLHDL 3.9   Thyroid function  studies No results for input(s): TSH, T4TOTAL, T3FREE, THYROIDAB in the last 72 hours.  Invalid input(s): FREET3 Anemia work up No results for input(s): VITAMINB12, FOLATE, FERRITIN, TIBC, IRON, RETICCTPCT in the last 72 hours. Microbiology Recent Results (from the past 240 hour(s))  Resp Panel by RT-PCR (Flu A&B, Covid) Nasopharyngeal Swab     Status: None   Collection Time: 05/14/20 11:30 PM   Specimen: Nasopharyngeal Swab; Nasopharyngeal(NP) swabs in vial transport medium  Result Value Ref Range Status   SARS Coronavirus 2 by RT PCR NEGATIVE NEGATIVE Final    Comment: (NOTE) SARS-CoV-2 target nucleic acids are NOT DETECTED.  The SARS-CoV-2 RNA is generally detectable in upper respiratory specimens during the acute phase of infection. The lowest concentration of SARS-CoV-2 viral copies this assay can detect is 138 copies/mL. A negative result does  not preclude SARS-Cov-2 infection and should not be used as the sole basis for treatment or other patient management decisions. A negative result may occur with  improper specimen collection/handling, submission of specimen other than nasopharyngeal swab, presence of viral mutation(s) within the areas targeted by this assay, and inadequate number of viral copies(<138 copies/mL). A negative result must be combined with clinical observations, patient history, and epidemiological information. The expected result is Negative.  Fact Sheet for Patients:  EntrepreneurPulse.com.au  Fact Sheet for Healthcare Providers:  IncredibleEmployment.be  This test is no t yet approved or cleared by the [place] FDA and  has been authorized for detection and/or diagnosis of SARS-CoV-2 by FDA under an Emergency Use Authorization (EUA). This EUA will remain  in effect (meaning this test can be used) for the duration of the COVID-19 declaration under Section 564(b)(1) of the Act, 21 U.S.C.section 360bbb-3(b)(1), unless the authorization is terminated  or revoked sooner.       Influenza A by PCR NEGATIVE NEGATIVE Final   Influenza B by PCR NEGATIVE NEGATIVE Final    Comment: (NOTE) The Xpert Xpress SARS-CoV-2/FLU/RSV plus assay is intended as an aid in the diagnosis of influenza from Nasopharyngeal swab specimens and should not be used as a sole basis for treatment. Nasal washings and aspirates are unacceptable for Xpert Xpress SARS-CoV-2/FLU/RSV testing.  Fact Sheet for Patients: EntrepreneurPulse.com.au  Fact Sheet for Healthcare Providers: IncredibleEmployment.be  This test is not yet approved or cleared by the [place] FDA and has been authorized for detection and/or diagnosis of SARS-CoV-2 by FDA under an Emergency Use Authorization (EUA). This EUA will remain in effect (meaning this test can be used) for the  duration of the COVID-19 declaration under Section 564(b)(1) of the Act, 21 U.S.C. section 360bbb-3(b)(1), unless the authorization is terminated or revoked.  Performed at [facility], [address] [place], [place] [address]      Signed: Terrilee Croak  [facility] 05/17/2020, 10:47 AM
